# Patient Record
Sex: Female | Born: 2000 | Race: White | Hispanic: No | Marital: Single | Smoking: Never smoker
Health system: Southern US, Community
[De-identification: ages and names within clinical notes are randomized; demographics above are authoritative.]

## PROBLEM LIST (undated history)

## (undated) DIAGNOSIS — Z789 Other specified health status: Secondary | ICD-10-CM

## (undated) HISTORY — PX: NO PAST SURGERIES: SHX2092

---

## 2019-11-13 ENCOUNTER — Other Ambulatory Visit: Payer: Self-pay

## 2019-11-13 ENCOUNTER — Ambulatory Visit (INDEPENDENT_AMBULATORY_CARE_PROVIDER_SITE_OTHER): Payer: Medicaid Other

## 2019-11-13 VITALS — Ht 62.0 in

## 2019-11-13 DIAGNOSIS — Z3201 Encounter for pregnancy test, result positive: Secondary | ICD-10-CM

## 2019-11-13 DIAGNOSIS — Z3400 Encounter for supervision of normal first pregnancy, unspecified trimester: Secondary | ICD-10-CM | POA: Insufficient documentation

## 2019-11-13 DIAGNOSIS — Z3200 Encounter for pregnancy test, result unknown: Secondary | ICD-10-CM

## 2019-11-13 LAB — POCT URINE PREGNANCY: Preg Test, Ur: POSITIVE — AB

## 2019-11-13 MED ORDER — BLOOD PRESSURE KIT DEVI: 1.0000 | 0 refills | Status: DC | PRN

## 2019-11-13 NOTE — Progress Notes (Signed)
Patient was assessed and managed by nursing staff during this encounter. I have reviewed the chart and agree with the documentation and plan. I have also made any necessary editorial changes.  Catalina Antigua, MD 11/13/2019 10:13 AM

## 2019-11-13 NOTE — Progress Notes (Signed)
..    Virtual Visit via Telephone Note  I connected with Laurie [name] on 11/13/19 at  8:20 AM EDT by telephone and verified that I am speaking with the correct person using two identifiers.  Location:[facility]  I discussed the limitations, risks, security and privacy concerns of performing an evaluation and management service by telephone and the availability of in person appointments. I also discussed with the patient that there may be a patient responsible charge related to this service. The patient expressed understanding and agreed to proceed.   History of Present Illness: PRENATAL INTAKE SUMMARY  Laurie [name] presents today New OB Nurse Interview.  OB History    Gravida  1   Para      Term      Preterm      AB      Living        SAB      TAB      Ectopic      Multiple      Live Births             I have reviewed the patient's medical, obstetrical, social, and family histories, medications, and available lab results.  SUBJECTIVE She has no unusual complaints   Observations/Objective: Initial nurse interview for history/labs (New OB)  EDD: 06-09-20 GA: [redacted]w[redacted]d GP: G1P0  GENERAL APPEARANCE: alert, well appearing  Assessment and Plan: Normal pregnancy First pregnancy, Pt denies history of any pre-existing conditions, FOB is involved and living together. [facility], Babyscripts sent to email.  Follow Up Instructions:   I discussed the assessment and treatment plan with the patient. The patient was provided an opportunity to ask questions and all were answered. The patient agreed with the plan and demonstrated an understanding of the instructions.   The patient was advised to call back or seek an in-person evaluation if the symptoms worsen or if the condition fails to improve as anticipated.  I provided 15 minutes of non-face-to-face time during this encounter.   Katrina Stack, RN

## 2019-12-01 ENCOUNTER — Other Ambulatory Visit: Payer: Self-pay

## 2019-12-01 ENCOUNTER — Other Ambulatory Visit (HOSPITAL_COMMUNITY)
Admission: RE | Admit: 2019-12-01 | Discharge: 2019-12-01 | Disposition: A | Discharge status: Home / Self Care | Payer: Medicaid Other | Source: Ambulatory Visit | Attending: Obstetrics and Gynecology | Admitting: Obstetrics and Gynecology

## 2019-12-01 ENCOUNTER — Encounter: Payer: Self-pay | Admitting: Obstetrics and Gynecology

## 2019-12-01 ENCOUNTER — Ambulatory Visit (INDEPENDENT_AMBULATORY_CARE_PROVIDER_SITE_OTHER): Payer: Medicaid Other | Admitting: Obstetrics and Gynecology

## 2019-12-01 ENCOUNTER — Ambulatory Visit (INDEPENDENT_AMBULATORY_CARE_PROVIDER_SITE_OTHER): Payer: Medicaid Other

## 2019-12-01 VITALS — BP 112/77 | HR 93 | Wt 131.0 lb

## 2019-12-01 DIAGNOSIS — Z3401 Encounter for supervision of normal first pregnancy, first trimester: Secondary | ICD-10-CM

## 2019-12-01 DIAGNOSIS — O3680X0 Pregnancy with inconclusive fetal viability, not applicable or unspecified: Secondary | ICD-10-CM

## 2019-12-01 DIAGNOSIS — Z3A01 Less than 8 weeks gestation of pregnancy: Secondary | ICD-10-CM | POA: Diagnosis not present

## 2019-12-01 DIAGNOSIS — Z3400 Encounter for supervision of normal first pregnancy, unspecified trimester: Secondary | ICD-10-CM | POA: Insufficient documentation

## 2019-12-01 NOTE — Patient Instructions (Signed)
First Trimester of Pregnancy  The first trimester of pregnancy is from week 1 until the end of week 13 (months 1 through 3). During this time, your baby will begin to develop inside you. At 6-8 weeks, the eyes and face are formed, and the heartbeat can be seen on ultrasound. At the end of 12 weeks, all the baby's organs are formed. Prenatal care is all the medical care you receive before the birth of your baby. Make sure you get good prenatal care and follow all of your doctor's instructions. Follow these instructions at home: Medicines  Take over-the-counter and prescription medicines only as told by your doctor. Some medicines are safe and some medicines are not safe during pregnancy.  Take a prenatal vitamin that contains at least 600 micrograms (mcg) of folic acid.  If you have trouble pooping (constipation), take medicine that will make your stool soft (stool softener) if your doctor approves. Eating and drinking   Eat regular, healthy meals.  Your doctor will tell you the amount of weight gain that is right for you.  Avoid raw meat and uncooked cheese.  If you feel sick to your stomach (nauseous) or throw up (vomit): ? Eat 4 or 5 small meals a day instead of 3 large meals. ? Try eating a few soda crackers. ? Drink liquids between meals instead of during meals.  To prevent constipation: ? Eat foods that are high in fiber, like fresh fruits and vegetables, whole grains, and beans. ? Drink enough fluids to keep your pee (urine) clear or pale yellow. Activity  Exercise only as told by your doctor. Stop exercising if you have cramps or pain in your lower belly (abdomen) or low back.  Do not exercise if it is too hot, too humid, or if you are in a place of great height (high altitude).  Try to avoid standing for long periods of time. Move your legs often if you must stand in one place for a long time.  Avoid heavy lifting.  Wear low-heeled shoes. Sit and stand up  straight.  You can have sex unless your doctor tells you not to. Relieving pain and discomfort  Wear a good support bra if your breasts are sore.  Take warm water baths (sitz baths) to soothe pain or discomfort caused by hemorrhoids. Use hemorrhoid cream if your doctor says it is okay.  Rest with your legs raised if you have leg cramps or low back pain.  If you have puffy, bulging veins (varicose veins) in your legs: ? Wear support hose or compression stockings as told by your doctor. ? Raise (elevate) your feet for 15 minutes, 3-4 times a day. ? Limit salt in your food. Prenatal care  Schedule your prenatal visits by the twelfth week of pregnancy.  Write down your questions. Take them to your prenatal visits.  Keep all your prenatal visits as told by your doctor. This is important. Safety  Wear your seat belt at all times when driving.  Make a list of emergency phone numbers. The list should include numbers for family, friends, the hospital, and police and fire departments. General instructions  Ask your doctor for a referral to a local prenatal class. Begin classes no later than at the start of month 6 of your pregnancy.  Ask for help if you need counseling or if you need help with nutrition. Your doctor can give you advice or tell you where to go for help.  Do not use hot tubs, steam   rooms, or saunas.  Do not douche or use tampons or scented sanitary pads.  Do not cross your legs for long periods of time.  Avoid all herbs and alcohol. Avoid drugs that are not approved by your doctor.  Do not use any tobacco products, including cigarettes, chewing tobacco, and electronic cigarettes. If you need help quitting, ask your doctor. You may get counseling or other support to help you quit.  Avoid cat litter boxes and soil used by cats. These carry germs that can cause birth defects in the baby and can cause a loss of your baby (miscarriage) or stillbirth.  Visit your dentist.  At home, brush your teeth with a soft toothbrush. Be gentle when you floss. Contact a doctor if:  You are dizzy.  You have mild cramps or pressure in your lower belly.  You have a nagging pain in your belly area.  You continue to feel sick to your stomach, you throw up, or you have watery poop (diarrhea).  You have a bad smelling fluid coming from your vagina.  You have pain when you pee (urinate).  You have increased puffiness (swelling) in your face, hands, legs, or ankles. Get help right away if:  You have a fever.  You are leaking fluid from your vagina.  You have spotting or bleeding from your vagina.  You have very bad belly cramping or pain.  You gain or lose weight rapidly.  You throw up blood. It may look like coffee grounds.  You are around people who have German measles, fifth disease, or chickenpox.  You have a very bad headache.  You have shortness of breath.  You have any kind of trauma, such as from a fall or a car accident. Summary  The first trimester of pregnancy is from week 1 until the end of week 13 (months 1 through 3).  To take care of yourself and your unborn baby, you will need to eat healthy meals, take medicines only if your doctor tells you to do so, and do activities that are safe for you and your baby.  Keep all follow-up visits as told by your doctor. This is important as your doctor will have to ensure that your baby is healthy and growing well. This information is not intended to replace advice given to you by your health care provider. Make sure you discuss any questions you have with your health care provider. Document Revised: 09/18/2018 Document Reviewed: 06/05/2016 Elsevier Patient Education  2020 [employer] Inc[employer]  

## 2019-12-01 NOTE — Progress Notes (Signed)
Pt is here for initial OB visit. Unsure of LMP, possibly 09/03/19. [place] today shows GA [redacted]w[redacted]d giving EDD 07/17/20

## 2019-12-01 NOTE — Progress Notes (Signed)
INITIAL PRENATAL VISIT NOTE  Subjective:  Laurie [name] is a 19 y.o. G1P0 at 1w2dby u/s due to unsure LMP being seen today for her initial prenatal visit. This is a planned pregnancy. She and partner are happy with the pregnancy. She has an uncomplicated medical history. Patient reports no complaints.  Contractions: Not present. Vag. Bleeding: None.   . Denies leaking of fluid.    History reviewed. No pertinent past medical history.  History reviewed. No pertinent surgical history.  OB History  Gravida Para Term Preterm AB Living  1            SAB TAB Ectopic Multiple Live Births               # Outcome Date GA Lbr Len/2nd Weight Sex Delivery Anes PTL Lv  1 Current             Social History   Socioeconomic History   Marital status: Single    Spouse name: Not on file   Number of children: Not on file   Years of education: Not on file   Highest education level: Not on file  Occupational History   Not on file  Tobacco Use   Smoking status: Not on file   Smokeless tobacco: Never Used  Substance and Sexual Activity   Alcohol use: Never   Drug use: Never   Sexual activity: Yes  Other Topics Concern   Not on file  Social History Narrative   Not on file   Social Determinants of Health   Financial Resource Strain:    Difficulty of Paying Living Expenses:   Food Insecurity:    Worried About Running Out of Food in the Last Year:    RArboriculturistin the Last Year:   Transportation Needs:    LFilm/video editor(Medical):    Lack of Transportation (Non-Medical):   Physical Activity:    Days of Exercise per Week:    Minutes of Exercise per Session:   Stress:    Feeling of Stress :   Social Connections:    Frequency of Communication with Friends and Family:    Frequency of Social Gatherings with Friends and Family:    Attends Religious Services:    Active Member of Clubs or Organizations:    Attends CArchivistMeetings:     Marital Status:     History reviewed. No pertinent family history.   Current Outpatient Medications:    Blood Pressure Monitoring (BLOOD PRESSURE KIT) DEVI, 1 kit by Does not apply route as needed., Disp: 1 each, Rfl: 0   Prenatal Vit-Fe Fumarate-FA (MULTIVITAMIN-PRENATAL) 27-0.8 MG TABS tablet, Take 1 tablet by mouth daily at 12 noon., Disp: , Rfl:   No Known Allergies  Review of Systems: Negative except for what is mentioned in HPI.  Objective:   Vitals:   12/01/19 1045  BP: 112/77  Pulse: 93  Weight: 131 lb (59.4 kg)    Fetal Status: Fetal Heart Rate (bpm): 161         Physical Exam: BP 112/77    Pulse 93    Wt 131 lb (59.4 kg)    LMP 09/03/2019    BMI 23.96 kg/m  CONSTITUTIONAL: Well-developed, well-nourished female in no acute distress.  NEUROLOGIC: Alert and oriented to person, place, and time. Normal reflexes, muscle tone coordination. No cranial nerve deficit noted. PSYCHIATRIC: Normal mood and affect. Normal behavior. Normal judgment and thought content. SKIN: Skin is warm  and dry. No rash noted. Not diaphoretic. No erythema. No pallor. HENT:  Normocephalic, atraumatic, External right and left ear normal. Oropharynx is clear and moist EYES: Conjunctivae and EOM are normal.  NECK: Normal range of motion, supple, no masses CARDIOVASCULAR: Normal heart rate noted, regular rhythm RESPIRATORY: Effort and breath sounds normal, no problems with respiration noted BREASTS: symmetric, non-tender, no masses palpable ABDOMEN: Soft, nontender, nondistended, gravid. GU: normal appearing external female genitalia, nulliparous normal appearing cervix, no discharge in vagina, no lesions noted Bimanual: 7 weeks sized uterus, no adnexal tenderness or palpable lesions noted MUSCULOSKELETAL: Normal range of motion. EXT:  No edema and no tenderness. 2+ distal pulses.   Assessment and Plan:  Pregnancy: G1P0 at 47w2dby u/s  1. Supervision of normal first pregnancy, antepartum  -  CBC/D/Plt+RPR+Rh+ABO+Rub Ab... - Culture, OB Urine - Cytology - PAP - Cervicovaginal ancillary only( [facility]) - Genetic Screening  2. Encounter to determine fetal viability of pregnancy, single or unspecified fetus  - [place] Limited; Future   Preterm labor symptoms and general obstetric precautions including but not limited to vaginal bleeding, contractions, leaking of fluid and fetal movement were reviewed in detail with the patient.  Please refer to After Visit Summary for other counseling recommendations.   Return in about 4 weeks (around 12/29/2019) for ROB.  LGriffin Basil6/22/2021 12:58 PM

## 2019-12-02 LAB — CBC/D/PLT+RPR+RH+ABO+RUB AB...
Antibody Screen: NEGATIVE
Basophils Absolute: 0.1 10*3/uL (ref 0.0–0.2)
Basos: 1 %
EOS (ABSOLUTE): 0.2 10*3/uL (ref 0.0–0.4)
Eos: 2 %
HCV Ab: <0.1 s/co ratio (ref 0.0–0.9)
HIV Screen 4th Generation wRfx: NONREACTIVE
Hematocrit: 38.9 % (ref 34.0–46.6)
Hemoglobin: 12.8 g/dL (ref 11.1–15.9)
Hepatitis B Surface Ag: NEGATIVE
Immature Grans (Abs): 0 10*3/uL (ref 0.0–0.1)
Immature Granulocytes: 1 %
Lymphocytes Absolute: 1.4 10*3/uL (ref 0.7–3.1)
Lymphs: 17 %
MCH: 28.9 pg (ref 26.6–33.0)
MCHC: 32.9 g/dL (ref 31.5–35.7)
MCV: 88 fL (ref 79–97)
Monocytes Absolute: 0.5 10*3/uL (ref 0.1–0.9)
Monocytes: 7 %
Neutrophils Absolute: 5.9 10*3/uL (ref 1.4–7.0)
Neutrophils: 72 %
Platelets: 311 10*3/uL (ref 150–450)
RBC: 4.43 x10E6/uL (ref 3.77–5.28)
RDW: 15.3 % (ref 11.7–15.4)
RPR Ser Ql: NONREACTIVE
Rh Factor: POSITIVE
Rubella Antibodies, IGG: 4.46 index (ref >0.99)
WBC: 8 10*3/uL (ref 3.4–10.8)

## 2019-12-02 LAB — HCV INTERPRETATION

## 2019-12-02 LAB — CERVICOVAGINAL ANCILLARY ONLY
Chlamydia: NEGATIVE
Comment: NEGATIVE
Comment: NORMAL
Neisseria Gonorrhea: NEGATIVE

## 2019-12-04 LAB — CYTOLOGY - PAP
Chlamydia: NEGATIVE
Comment: NEGATIVE
Comment: NEGATIVE
Comment: NORMAL
Diagnosis: UNDETERMINED — AB
High risk HPV: POSITIVE — AB
Neisseria Gonorrhea: NEGATIVE

## 2019-12-04 LAB — URINE CULTURE, OB REFLEX

## 2019-12-04 LAB — CULTURE, OB URINE

## 2019-12-08 ENCOUNTER — Other Ambulatory Visit: Payer: Self-pay

## 2019-12-08 ENCOUNTER — Telehealth: Payer: Self-pay

## 2019-12-08 DIAGNOSIS — O2340 Unspecified infection of urinary tract in pregnancy, unspecified trimester: Secondary | ICD-10-CM

## 2019-12-08 MED ORDER — NITROFURANTOIN MONOHYD MACRO 100 MG PO CAPS: 100.0000 mg | ORAL_CAPSULE | Freq: Two times a day (BID) | ORAL | 0 refills | Status: DC

## 2019-12-08 NOTE — Telephone Encounter (Signed)
TC to pt to make aware of pap results ASCUS repeat in 1 yr per Dr.Bass and UTI Rx for Macrobid was sent No answer @ 8:13am and no vm is set up to leave a message.

## 2019-12-29 ENCOUNTER — Ambulatory Visit (INDEPENDENT_AMBULATORY_CARE_PROVIDER_SITE_OTHER): Payer: Medicaid Other | Admitting: Obstetrics and Gynecology

## 2019-12-29 ENCOUNTER — Encounter: Payer: Self-pay | Admitting: Obstetrics and Gynecology

## 2019-12-29 ENCOUNTER — Other Ambulatory Visit: Payer: Self-pay

## 2019-12-29 VITALS — BP 107/70 | HR 93 | Wt 135.2 lb

## 2019-12-29 DIAGNOSIS — Z3400 Encounter for supervision of normal first pregnancy, unspecified trimester: Secondary | ICD-10-CM

## 2019-12-29 DIAGNOSIS — Z3401 Encounter for supervision of normal first pregnancy, first trimester: Secondary | ICD-10-CM

## 2019-12-29 DIAGNOSIS — Z3A11 11 weeks gestation of pregnancy: Secondary | ICD-10-CM

## 2019-12-29 NOTE — Progress Notes (Signed)
   PRENATAL VISIT NOTE  Subjective:  Laurie [name] is a 19 y.o. G1P0 at [redacted]w[redacted]d being seen today for ongoing prenatal care.  She is currently monitored for the following issues for this low-risk pregnancy and has Supervision of normal first pregnancy, antepartum on their problem list.  Patient reports no complaints.  Contractions: Not present. Vag. Bleeding: None.   . Denies leaking of fluid.   The following portions of the patient's history were reviewed and updated as appropriate: allergies, current medications, past family history, past medical history, past social history, past surgical history and problem list.   Objective:   Vitals:   12/29/19 0925  BP: 107/70  Pulse: 93  Weight: 61.3 kg    Fetal Status: Fetal Heart Rate (bpm): 156         General:  Alert, oriented and cooperative. Patient is in no acute distress.  Skin: Skin is warm and dry. No rash noted.   Cardiovascular: Normal heart rate noted  Respiratory: Normal respiratory effort, no problems with respiration noted  Abdomen: Soft, gravid, appropriate for gestational age.  Pain/Pressure: Absent     Pelvic: Cervical exam deferred        Extremities: Normal range of motion.  Edema: None  Mental Status: Normal mood and affect. Normal behavior. Normal judgment and thought content.   Assessment and Plan:  Pregnancy: G1P0 at [redacted]w[redacted]d 1. Supervision of normal first pregnancy, antepartum - Will preform genetic testing today.  Preterm labor symptoms and general obstetric precautions including but not limited to vaginal bleeding, contractions, leaking of fluid and fetal movement were reviewed in detail with the patient. Please refer to After Visit Summary for other counseling recommendations.   Return in about 4 weeks (around 01/26/2020) for ROB.  No future appointments.  Johnny Bridge, MD

## 2020-01-04 ENCOUNTER — Encounter: Payer: Self-pay | Admitting: Obstetrics and Gynecology

## 2020-01-12 ENCOUNTER — Encounter: Payer: Self-pay | Admitting: Obstetrics and Gynecology

## 2020-01-26 ENCOUNTER — Encounter: Payer: Self-pay | Admitting: Obstetrics and Gynecology

## 2020-01-26 ENCOUNTER — Ambulatory Visit (INDEPENDENT_AMBULATORY_CARE_PROVIDER_SITE_OTHER): Payer: Medicaid Other | Admitting: Obstetrics and Gynecology

## 2020-01-26 ENCOUNTER — Other Ambulatory Visit: Payer: Self-pay

## 2020-01-26 ENCOUNTER — Other Ambulatory Visit (HOSPITAL_COMMUNITY)
Admission: RE | Admit: 2020-01-26 | Discharge: 2020-01-26 | Disposition: A | Discharge status: Home / Self Care | Payer: Medicaid Other | Source: Ambulatory Visit | Attending: Obstetrics and Gynecology | Admitting: Obstetrics and Gynecology

## 2020-01-26 DIAGNOSIS — N898 Other specified noninflammatory disorders of vagina: Secondary | ICD-10-CM | POA: Diagnosis present

## 2020-01-26 DIAGNOSIS — O26892 Other specified pregnancy related conditions, second trimester: Secondary | ICD-10-CM | POA: Diagnosis not present

## 2020-01-26 DIAGNOSIS — Z3A15 15 weeks gestation of pregnancy: Secondary | ICD-10-CM | POA: Insufficient documentation

## 2020-01-26 DIAGNOSIS — Z3400 Encounter for supervision of normal first pregnancy, unspecified trimester: Secondary | ICD-10-CM

## 2020-01-26 NOTE — Progress Notes (Signed)
   PRENATAL VISIT NOTE  Subjective:  Laurie [name] is a 19 y.o. G1P0 at [redacted]w[redacted]d being seen today for ongoing prenatal care.  She is currently monitored for the following issues for this low-risk pregnancy and has Supervision of normal first pregnancy, antepartum; Vaginal discharge during pregnancy in second trimester; and [redacted] weeks gestation of pregnancy on their problem list.  Patient doing well with no acute concerns today. She reports vaginal irritation.  Contractions: Not present. Vag. Bleeding: None.  Movement: Present. Denies leaking of fluid.   Pt notes she does not eat any vegetables and is concerned about her nutrition now that she is pregnant.  She has never really eaten vegetables.  Pt will receive dietary referral.  The following portions of the patient's history were reviewed and updated as appropriate: allergies, current medications, past family history, past medical history, past social history, past surgical history and problem list. Problem list updated.  Objective:   Vitals:   01/26/20 0949  BP: 110/74  Pulse: 92  Weight: 137 lb (62.1 kg)    Fetal Status: Fetal Heart Rate (bpm): 146 Fundal Height: 15 cm Movement: Present     General:  Alert, oriented and cooperative. Patient is in no acute distress.  Skin: Skin is warm and dry. No rash noted.   Cardiovascular: Normal heart rate noted  Respiratory: Normal respiratory effort, no problems with respiration noted  Abdomen: Soft, gravid, appropriate for gestational age.  Pain/Pressure: Absent     Pelvic: Cervical exam deferred        Extremities: Normal range of motion.  Edema: None  Mental Status:  Normal mood and affect. Normal behavior. Normal judgment and thought content.   Assessment and Plan:  Pregnancy: G1P0 at [redacted]w[redacted]d  1. Supervision of normal first pregnancy, antepartum Routine PNC, anatomy scan ordered - AFP, Serum, Open Spina Bifida - [place] MFM OB COMP + 14 WK; Future - Referral to Nutrition and Diabetes  Services  2. Vaginal discharge during pregnancy in second trimester  - Cervicovaginal ancillary only( [facility])  Preterm labor symptoms and general obstetric precautions including but not limited to vaginal bleeding, contractions, leaking of fluid and fetal movement were reviewed in detail with the patient.  Please refer to After Visit Summary for other counseling recommendations.   Return in about 4 weeks (around 02/23/2020) for ROB, in person.   Mariel Aloe, MD

## 2020-01-26 NOTE — Patient Instructions (Signed)

## 2020-01-26 NOTE — Progress Notes (Signed)
ROB    CC: Frequent Vaginal Yeast Infections.   Pt Declines vaginal swab today would like prescription for yeast.

## 2020-01-27 ENCOUNTER — Telehealth: Payer: Self-pay

## 2020-01-27 LAB — CERVICOVAGINAL ANCILLARY ONLY
Bacterial Vaginitis (gardnerella): POSITIVE — AB
Candida Glabrata: NEGATIVE
Candida Vaginitis: POSITIVE — AB
Chlamydia: NEGATIVE
Comment: NEGATIVE
Comment: NEGATIVE
Comment: NEGATIVE
Comment: NEGATIVE
Comment: NEGATIVE
Comment: NORMAL
Neisseria Gonorrhea: NEGATIVE
Trichomonas: NEGATIVE

## 2020-01-27 MED ORDER — METRONIDAZOLE 500 MG PO TABS
500.0000 mg | ORAL_TABLET | Freq: Two times a day (BID) | ORAL | 0 refills | Status: DC
Start: 2020-01-27 — End: 2020-02-23

## 2020-01-27 MED ORDER — TERCONAZOLE 0.4 % VA CREA: 1.0000 | TOPICAL_CREAM | Freq: Every day | VAGINAL | 0 refills | Status: DC

## 2020-01-27 NOTE — Telephone Encounter (Signed)
S/w pt and advised of results and rx 

## 2020-01-28 LAB — AFP, SERUM, OPEN SPINA BIFIDA
AFP MoM: 2.06
AFP Value: 68.7 ng/mL
Gest. Age on Collection Date: 15.3 weeks
Maternal Age At EDD: 21.8 yr
OSBR Risk 1 IN: 706
Test Results:: NEGATIVE
Weight: 137 [lb_av]

## 2020-01-29 ENCOUNTER — Encounter: Payer: Medicaid Other | Attending: Obstetrics and Gynecology | Admitting: Dietician

## 2020-02-02 ENCOUNTER — Telehealth: Payer: Self-pay

## 2020-02-02 NOTE — Telephone Encounter (Signed)
Unable to reach pt to r/s N&D appt; vm not set up

## 2020-02-02 NOTE — Telephone Encounter (Signed)
-----   Message from Warden Fillers, MD sent at 02/01/2020  8:41 AM EDT ----- Regarding: nutrition Pt missed her nutrition appointment.  Please contact pt and advise her to reschedule.

## 2020-02-03 NOTE — Telephone Encounter (Signed)
2nd attempt - unable to reach pt regarding missed N&D appt Letter mailed

## 2020-02-23 ENCOUNTER — Ambulatory Visit: Payer: Medicaid Other | Attending: Obstetrics and Gynecology

## 2020-02-23 ENCOUNTER — Encounter: Payer: Self-pay | Admitting: Obstetrics and Gynecology

## 2020-02-23 ENCOUNTER — Ambulatory Visit (INDEPENDENT_AMBULATORY_CARE_PROVIDER_SITE_OTHER): Payer: Medicaid Other | Admitting: Obstetrics and Gynecology

## 2020-02-23 ENCOUNTER — Other Ambulatory Visit: Payer: Self-pay

## 2020-02-23 ENCOUNTER — Other Ambulatory Visit: Payer: Self-pay | Admitting: *Deleted

## 2020-02-23 VITALS — BP 137/71 | HR 80 | Wt 138.0 lb

## 2020-02-23 DIAGNOSIS — Z3A19 19 weeks gestation of pregnancy: Secondary | ICD-10-CM

## 2020-02-23 DIAGNOSIS — Z362 Encounter for other antenatal screening follow-up: Secondary | ICD-10-CM

## 2020-02-23 DIAGNOSIS — Z3400 Encounter for supervision of normal first pregnancy, unspecified trimester: Secondary | ICD-10-CM | POA: Diagnosis not present

## 2020-02-23 DIAGNOSIS — Z363 Encounter for antenatal screening for malformations: Secondary | ICD-10-CM

## 2020-02-23 NOTE — Progress Notes (Signed)
Pt was seen for u/s this morning. Was scheduled for repeat due to fetal position.

## 2020-02-23 NOTE — Progress Notes (Signed)
   PRENATAL VISIT NOTE  Subjective:  Laurie [name] is a 19 y.o. G1P0 at [redacted]w[redacted]d being seen today for ongoing prenatal care.  She is currently monitored for the following issues for this low-risk pregnancy and has Supervision of normal first pregnancy, antepartum; Vaginal discharge during pregnancy in second trimester; and [redacted] weeks gestation of pregnancy on their problem list.  Patient reports no complaints.  Contractions: Not present. Vag. Bleeding: None.  Movement: Present. Denies leaking of fluid.   The following portions of the patient's history were reviewed and updated as appropriate: allergies, current medications, past family history, past medical history, past social history, past surgical history and problem list.   Objective:   Vitals:   02/23/20 1113  BP: 137/71  Pulse: 80  Weight: 138 lb (62.6 kg)    Fetal Status: Fetal Heart Rate (bpm): 145   Movement: Present     General:  Alert, oriented and cooperative. Patient is in no acute distress.  Skin: Skin is warm and dry. No rash noted.   Cardiovascular: Normal heart rate noted  Respiratory: Normal respiratory effort, no problems with respiration noted  Abdomen: Soft, gravid, appropriate for gestational age.  Pain/Pressure: Absent     Pelvic: Cervical exam deferred        Extremities: Normal range of motion.     Mental Status: Normal mood and affect. Normal behavior. Normal judgment and thought content.   Assessment and Plan:  Pregnancy: G1P0 at [redacted]w[redacted]d 1. Supervision of normal first pregnancy, antepartum - Ultrasound performed today within normal limits.  Incomplete anatomy seen.  Patient is scheduled for fu ultrasound in 4 weeks. - Benefits of the flu vaccination discussed.  Patient declines vaccination.    Preterm labor symptoms and general obstetric precautions including but not limited to vaginal bleeding, contractions, leaking of fluid and fetal movement were reviewed in detail with the patient. Please refer to After  Visit Summary for other counseling recommendations.   Return in about 4 weeks (around 03/22/2020) for LROB - virtual.  Future Appointments  Date Time Provider Department Center  03/22/2020 12:45 PM [facility]    Johnny Bridge, MD

## 2020-02-23 NOTE — Patient Instructions (Signed)
Preventing Influenza, Adult Influenza, more commonly known as "the flu," is a viral infection that mainly affects the respiratory tract. The respiratory tract includes structures that help you breathe, such as the lungs, nose, and throat. The flu causes many common cold symptoms, as well as a high fever and body aches. The flu spreads easily from person to person (is contagious). The flu is most common from December through March. This is called flu season.You can catch the flu virus by:  Breathing in droplets from an infected person's cough or sneeze.  Touching something that was recently contaminated with the virus and then touching your mouth, nose, or eyes. What can I do to lower my risk?        You can decrease your risk of getting the flu by:  Getting a flu shot (influenza vaccination) every year. This is the best way to prevent the flu. A flu shot is recommended for everyone age 6 months and older. ? It is best to get a flu shot in the fall, as soon as it is available. Getting a flu shot during winter or spring instead is still a good idea. Flu season can last into early spring. ? Preventing the flu through vaccination requires getting a new flu shot every year. This is because the flu virus changes slightly (mutates) from one year to the next. Even if a flu shot does not completely protect you from all flu virus mutations, it can reduce the severity of your illness and prevent dangerous complications of the flu. ? If you are pregnant, you can and should get a flu shot. ? If you have had a reaction to the shot in the past or if you are allergic to eggs, check with your health care provider before getting a flu shot. ? Sometimes the vaccine is available as a nasal spray. In some years, the nasal spray has not been as effective against the flu virus. Check with your health care provider if you have questions about this.  Practicing good health habits. This is especially important during  flu season. ? Avoid contact with people who are sick with flu or cold symptoms. ? Wash your hands with soap and water often. If soap and water are not available, use alcohol-based hand sanitizer. ? Avoid touching your hands to your face, especially when you have not washed your hands recently. ? Use a disinfectant to clean surfaces at home and at work that may be contaminated with the flu virus. ? Keep your body's disease-fighting system (immune system) in good shape by eating a healthy diet, drinking plenty of fluids, getting enough sleep, and exercising regularly. If you do get the flu, avoid spreading it to others by:  Staying home until your symptoms have been gone for at least one day.  Covering your mouth and nose when you cough or sneeze.  Avoiding close contact with others, especially babies and elderly people. Why are these changes important? Getting a flu shot and practicing good health habits protects you as well as other people. If you get the flu, your friends, family, and co-workers are also at risk of getting it, because it spreads so easily to others. Each year, about 2 out of every 10 people get the flu. Having the flu can lead to complications, such as pneumonia, ear infection, and sinus infection. The flu also can be deadly, especially for babies, people older than age 65, and people who have serious long-term diseases. How is this treated? Most   people recover from the flu by resting at home and drinking plenty of fluids. However, a prescription antiviral medicine may reduce your flu symptoms and may make your flu go away sooner. This medicine must be started within a few days of getting flu symptoms. You can talk with your health care provider about whether you need an antiviral medicine. Antiviral medicine may be prescribed for people who are at risk for more serious flu symptoms. This includes people who:  Are older than age 65.  Are pregnant.  Have a condition that  makes the flu worse or more dangerous. Where to find more information  Centers for Disease Control and Prevention: www.cdc.gov/flu/index.htm  Flu.gov: www.flu.gov/prevention-vaccination  American Academy of [employer]icians: familydoctor.org/familydoctor/en/kids/vaccines/preventing-the-flu.html Contact a health care provider if:  You have influenza and you develop new symptoms.  You have: ? Chest pain. ? Diarrhea. ? A fever.  Your cough gets worse, or you produce more mucus. Summary  The best way to prevent the flu is to get a flu shot every year in the fall.  Even if you get the flu after you have received the yearly vaccine, your flu may be milder and go away sooner because of your flu shot.  If you get the flu, antiviral medicines that are started with a few days of symptoms may reduce your flu symptoms and may make your flu go away sooner.  You can also help prevent the flu by practicing good health habits. This information is not intended to replace advice given to you by your health care provider. Make sure you discuss any questions you have with your health care provider. Document Revised: 05/10/2017 Document Reviewed: 02/04/2016 Elsevier Patient Education  2020 [employer]  

## 2020-03-22 ENCOUNTER — Ambulatory Visit: Payer: Medicaid Other | Attending: Obstetrics

## 2020-03-22 ENCOUNTER — Other Ambulatory Visit: Payer: Self-pay

## 2020-03-22 ENCOUNTER — Telehealth (INDEPENDENT_AMBULATORY_CARE_PROVIDER_SITE_OTHER): Payer: Medicaid Other | Admitting: Obstetrics and Gynecology

## 2020-03-22 ENCOUNTER — Encounter: Payer: Self-pay | Admitting: Obstetrics and Gynecology

## 2020-03-22 DIAGNOSIS — Z3400 Encounter for supervision of normal first pregnancy, unspecified trimester: Secondary | ICD-10-CM

## 2020-03-22 DIAGNOSIS — Z3A23 23 weeks gestation of pregnancy: Secondary | ICD-10-CM

## 2020-03-22 DIAGNOSIS — Z3402 Encounter for supervision of normal first pregnancy, second trimester: Secondary | ICD-10-CM

## 2020-03-22 DIAGNOSIS — Z362 Encounter for other antenatal screening follow-up: Secondary | ICD-10-CM | POA: Insufficient documentation

## 2020-03-22 NOTE — Progress Notes (Signed)
Virtual ROB  CC: swelling while standing at work.  Pt not able to check B/P while on phone today. Denies any HA's and no dizziness.

## 2020-03-22 NOTE — Progress Notes (Signed)
   PRENATAL VISIT NOTE  Subjective:  Laurie [name] is a 19 y.o. G1P0 at [redacted]w[redacted]d being seen today for ongoing prenatal care.  She is currently monitored for the following issues for this low-risk pregnancy and has Supervision of normal first pregnancy, antepartum; Vaginal discharge during pregnancy in second trimester; and [redacted] weeks gestation of pregnancy on their problem list.  Patient reports no complaints.  Contractions: Not present. Vag. Bleeding: None.  Movement: Present. Denies leaking of fluid.   The patient was unable to check her blood pressure today.    The following portions of the patient's history were reviewed and updated as appropriate: allergies, current medications, past family history, past medical history, past social history, past surgical history and problem list.   Objective:  There were no vitals filed for this visit.  Fetal Status:     Movement: Present     General:  Alert, oriented and cooperative. Patient is in no acute distress.  Skin: Skin is warm and dry. No rash noted.   Cardiovascular: Normal heart rate noted  Respiratory: Normal respiratory effort, no problems with respiration noted  Abdomen: Soft, gravid, appropriate for gestational age.  Pain/Pressure: Absent     Pelvic: Cervical exam deferred        Extremities: Normal range of motion.  Edema: Trace  Mental Status: Normal mood and affect. Normal behavior. Normal judgment and thought content.   Assessment and Plan:  Pregnancy: G1P0 at [redacted]w[redacted]d 1. Supervision of normal first pregnancy, antepartum - Patient for follow up anatomy screen today.    Preterm labor symptoms and general obstetric precautions including but not limited to vaginal bleeding, contractions, leaking of fluid and fetal movement were reviewed in detail with the patient. Please refer to After Visit Summary for other counseling recommendations.   No follow-ups on file.  Future Appointments  Date Time Provider Department Center  03/22/2020  12:45 PM [facility] US[facility]    Johnny Bridge, MD

## 2020-04-19 ENCOUNTER — Ambulatory Visit (INDEPENDENT_AMBULATORY_CARE_PROVIDER_SITE_OTHER): Payer: Medicaid Other | Admitting: Nurse Practitioner

## 2020-04-19 ENCOUNTER — Other Ambulatory Visit: Payer: Self-pay

## 2020-04-19 ENCOUNTER — Encounter: Payer: Self-pay | Admitting: Nurse Practitioner

## 2020-04-19 VITALS — BP 126/75 | HR 93 | Wt 153.8 lb

## 2020-04-19 DIAGNOSIS — Z719 Counseling, unspecified: Secondary | ICD-10-CM

## 2020-04-19 DIAGNOSIS — Z3400 Encounter for supervision of normal first pregnancy, unspecified trimester: Secondary | ICD-10-CM

## 2020-04-19 DIAGNOSIS — Z3A27 27 weeks gestation of pregnancy: Secondary | ICD-10-CM | POA: Diagnosis not present

## 2020-04-19 NOTE — Progress Notes (Signed)
ROB 27w  CC: swelling in feet after working.   Pt brought cuff to office today states lasat virtual she did not know how to use cuff  *Pt was shown correct way to use B/P cuff.

## 2020-04-19 NOTE — Progress Notes (Signed)
    Subjective:  Laurie [name] is a 19 y.o. G1P0 at [redacted]w[redacted]d being seen today for ongoing prenatal care.  She is currently monitored for the following issues for this low-risk pregnancy and has Supervision of normal first pregnancy, antepartum and Vaginal discharge during pregnancy in second trimester on their problem list.  Patient reports swelling of ankles when she woris.  Contractions: Not present. Vag. Bleeding: None.  Movement: Present. Denies leaking of fluid.   The following portions of the patient's history were reviewed and updated as appropriate: allergies, current medications, past family history, past medical history, past social history, past surgical history and problem list. Problem list updated.  Objective:   Vitals:   04/19/20 1100  BP: 126/75  Pulse: 93  Weight: 153 lb 12.8 oz (69.8 kg)    Fetal Status: Fetal Heart Rate (bpm): 150 Fundal Height: 28 cm Movement: Present     General:  Alert, oriented and cooperative. Patient is in no acute distress.  Skin: Skin is warm and dry. No rash noted.   Cardiovascular: Normal heart rate noted  Respiratory: Normal respiratory effort, no problems with respiration noted  Abdomen: Soft, gravid, appropriate for gestational age. Pain/Pressure: Absent     Pelvic:  Cervical exam deferred        Extremities: Normal range of motion.  Edema: Trace  Mental Status: Normal mood and affect. Normal behavior. Normal judgment and thought content.   Urinalysis:      Assessment and Plan:  Pregnancy: G1P0 at [redacted]w[redacted]d  1. Supervision of normal first pregnancy, antepartum Advised compression knee highs to wear at work Advised to select pediatrician - list given Reviewed guidance for fasting appointment at next visit for glucola Advised signing up for breastfeeding and childbirth classes Staff taught client how to use BP cuff today - does not have babyscripts app - reviewed BP reading that would be too high in pregnancy 140/90 - either  value  COVID-19 Vaccine Counseling: The patient was counseled on the potential benefits and lack of known risks of COVID vaccination, during pregnancy and breastfeeding, during today's visit. The patient's questions and concerns were addressed today, including safety of the vaccination and potential side effects as they have been published by ACOG and [facility]. The patient has been informed that there have not been any documented vaccine related injuries, deaths or birth defects to infant or mom after receiving the COVID-19 vaccine to date. The patient has been made aware that although she is not at increased risk of contracting COVID-19 during pregnancy, she is at increased risk of developing severe disease and complications if she contracts COVID-19 while pregnant. All patient questions were addressed during our visit today. The patient is still unsure of her decision for vaccination.    Preterm labor symptoms and general obstetric precautions including but not limited to vaginal bleeding, contractions, leaking of fluid and fetal movement were reviewed in detail with the patient. Please refer to After Visit Summary for other counseling recommendations.  Return in about 2 weeks (around 05/03/2020) for ROB and early AM appointment for 2 hr glucola.  Nolene Bernheim, RN, MSN, NP-BC Nurse Practitioner, [facility] for [employer], [facility] 04/19/2020 1:25 PM

## 2020-04-19 NOTE — Patient Instructions (Signed)
Sign up for classes at [email or web address]   Check out Covid vaccine info here:   [email or web address]  7 Things You Should Know About the COVID-19 Vaccines  Here are seven facts you should know before taking your shot:  1.  No serious side effects were reported in clinical trials. Temporary reactions after receiving the vaccine may include a sore arm, headache, feeling tired and achy for a day or two or, in some cases, fever. In most cases, these reactions are good signs that your body is building protection.   2.  Scientists had a head start. They are built on decades of research on vaccines for similar viruses. A big investment of resources and focus made sure they were created without skipping any steps in development, testing, or clinical trials.  3. You cannot get COVID-19 from the vaccine. The vaccine gives your body instructions to make a protein that safely teaches you to make germ-fighting antibodies to fight the real COVID-19.  4.The vaccine protects against the Delta variant. The Delta variant, which is now predominant in [place], is much more contagious than the original virus. Vaccines continue to be remarkably effective in reducing risk of severe disease, hospitalization, and death, even against the Delta variant.  5. A hundred million people in the [place] have already received their COVID-19 vaccine.  6. It works. And once you're fully vaccinated you're protected. The vaccines are proven to help prevent COVID-19 and are effective in preventing hospitalization and death.   7. The vaccine does not affect fertility. Vaccination for those who are pregnant or wanting to become pregnant is recommended by the [employer] of Obstetricians and Gynecologists (ACOG), the Society for Maternal-Fetal Medicine (SMFM), the American Society for Reproductive Medicine (ASRM), and the Society for Female Reproduction and Urology.   AREA PEDIATRIC/FAMILY PRACTICE  PHYSICIANS  [place]outheast [place] ([address]) . [facility] Melodie Bouillon, MD; Lum Babe, MD; Sheffield Slider, MD; Leveda Anna, MD; McDiarmid, MD; Jerene Bears, MD; Jennette Kettle, MD; Gwendolyn Grant, MD o [address] [place], [place] [address] o [phone] o Mon-Fri 8:30-12:30, 1:30-5:00 o Providers come to see babies at [facility] o Accepting Medicaid . [facility] [place] o Limited providers who accept newborns: Docia Chuck, MD; Kateri Plummer, MD; Paulino Rily, MD o [address], [place], [place] [address] o [phone] o Mon-Fri 8:00-5:30 o Babies seen by providers at [facility] o Does NOT accept Medicaid o Please call early in hospitalization for appointment (limited availability)  . [facility] Fatima Sanger, MD o [address], [place], [place] [address] o [phone] o Mon, Tue, Thur, Fri 8:30-5:00, Wed 10:00-7:00 (closed 1-2pm) o Babies seen by [facility] providers o Accepting Medicaid . Donnie Coffin - Pediatrician Fae Pippin, MD o [address], [place], [place] [address] o [phone] o Mon-Fri 8:30-5:00, Sat 8:30-12:00 o Provider comes to see babies at [facility] o Accepting Medicaid o Must have been referred from current patients or contacted office prior to delivery . [facility]) Leotis Pain, MD; Ave Filter, MD; Luna Fuse, MD; Kennedy Bucker, MD; Konrad Dolores, MD; Kathlene November, MD; Jenne Campus, MD; Lubertha South, MD; Wynetta Emery, MD; Duffy Rhody, MD; Gerre Couch, NP; Shirl Harris, NP o [address], [place], [place] [address] o [phone] o Mon, Tue, Thur, Fri 8:30-5:30, Wed 9:30-5:30, Sat 8:30-12:30 o Babies seen by [facility] providers o Accepting Medicaid o Only accepting infants of first-time parents or siblings of current patients [facility] discharge coordinator will  make follow-up appointment . Cyril Mourning o [address], [place], [place]   [address] o [phone]   Fax - [phone] . [facility] o [address], [place], [place]  [address] o Phone - [phone]   Fax - [phone] . Lucio Edward o [address], [place], [place]  [address] o [phone]  East/[place]ortheast [place] [phone]) . [facility] Jorge Mandril, MD; Alita Chyle, MD; Princella Ion, MD; MD; Earlene Plater, MD; Jamesetta Orleans, MD; Alvera Novel, MD; Clarene Duke, MD; Rana Snare, MD; Carmon Ginsberg, MD; Alinda Money, MD; Hosie Poisson, MD; Mayford Knife, MD o [address], [place], [place] [address] o [phone] o Mon-Fri 8:30-5:00 (extended evenings Mon-Thur as needed), Sat-Sun 10:00-1:00 o Providers come to see babies at [facility] o Accepting Medicaid for families of first-time babies and families with all children in the household age 67 and under. Must register with office prior to making appointment (M-F only). [facility] Odella Aquas, NP; Lynelle Doctor, MD; Susann Givens, MD; [place], [place] o [address] [place], [place] [address] o [phone] o Mon-Fri 8:00-5:00 o Babies seen by providers at [facility] o Does NOT accept Medicaid/Commercial Insurance Only . [facility] [place] ([facility])  Suzette Battiest, MD; Zachery Dauer, MD; Stefan Church, MD; Sabino Dick, MD; Quitman Livings, MD; Farris Has, MD; Gaynell Face, MD; Betha Loa, MD; Colon Flattery, MD; Clifton James, MD o [address] [place] [place], [place] [address] o [phone] o Mon-Fri 8:30-5:30, Sat (Oct.-Mar.) 9:00-1:00 o Babies seen by providers at [facility] o Accepting [facility] [phone]) . [facility] Dimitri, MD; Sheliah Hatch, MD o [address], [place], [place] [address] o [phone] o Mon-Fri 8:30-5:00, Sat 8:30-12:00 o Providers come to see babies at [facility] o Does NOT accept Medicaid . [facility] Cindy Hazy, [place]; Story, MD; [place], [place]; Wynelle Link, MD; Azucena Cecil, MD o [address] [place], [place]  [address] o [phone] o Mon-Fri 8:00-5:00 o Babies seen by providers at [facility] o Does NOT accept Medicaid o Only accepting babies of parents who are patients o Please call early in hospitalization for appointment (limited availability) . [facility] Pediatricians Lamar Benes, MD; Abran Cantor, MD; Early Osmond, MD; Cherre Huger, NP; Hyacinth Meeker, MD; Dwan Bolt, MD; Jarold Motto, NP; Dario Guardian, MD; Talmage Nap, MD; Maisie Fus, MD; Pricilla Holm, MD; Tama High, MD o [address] [place], [place], [place] [address] o [phone] o Mon-Fri 8:00-5:00, Sat 9:00-12:00 o Providers come to see babies at [facility] o Does NOT accept [facility] [phone]) . [facility] at [facility] o Limited providers accepting new patients: Drema Pry, NP; [place]on, PA o [address], [place], [place] [address] o [phone] o Mon-Fri 8:00-5:00 o Babies seen by providers at [facility] o Does NOT accept Medicaid o Only accepting babies of parents who are patients o Please call early in hospitalization for appointment (limited availability) . [facility] Luan Pulling, MD; Nash Dimmer, MD o [address] [place], [place] [address] o [phone] (press 1 to schedule appointment) o Mon-Fri 8:00-5:00 o Providers come to see babies at [facility] o Does NOT accept Medicaid . [facility] Cristino Martes, MD o [address], [place], [place] [address] o [phone] o Mon-Fri 8:30-5:00 (lunch 12:30-1:00), extended hours by appointment only Wed 5:00-6:30 o Babies seen by [facility] providers o Accepting Medicaid . [facility] Abbot, MD; [place], MD; Hassan Rowan, MD o [address] [place], [place], [place] [address] o [phone] o Mon-Fri 8:00-5:00 o Babies seen by [facility]  providers o Does NOT accept Medicaid . Nature conservation officer at [place] [address] Elsworth Soho, MD; Durene Cal, MD; [place], DO o [address] [place], [place] [address] o [phone] o Mon-Fri  8:00-5:00 o Babies seen by [facility] providers o Does NOT accept Medicaid . [facility] o [place], [place]; [place], [place]; Luquillo, NP; Avis Epley, MD; Vonna Kotyk, MD; Clance Boll, MD; Stevphen Rochester, NP; Arvilla Market, NP; Ann Maki, NP; Otis Dials, NP; Vaughan Basta, MD; Alianza, MD o [address] [place], [place] [address] o [phone] o Mon-Fri 8:30-5:00, Sat 10:00-1:00 o Providers come to see babies at [facility] o Does NOT accept Medicaid o Free prenatal information session Tuesdays at 4:45pm . [facility] Luna Kitchens, MD; [place], [place]; [place], [place]; [place], [place] o [address] [place] [address] o [phone] o Mon-Fri 7:30-5:30 o Babies seen by [facility] providers . [facility] Doctor o [address], [place], [place]  [address] o [phone]   Fax - [phone]  [place] [phone] & [phone]) . [facility] Alphonsa Overall, MD o [address] [place], [place] [address] o [phone] o Mon-Thur 8:00-6:00 o Providers come to see babies at [facility] o Accepting Medicaid . [facility] Zenon Mayo, NP; Cyndia Bent, MD; [place], [place]; [place], [place] o [address] [place], [place] [address] o [phone] o Mon-Thur 7:30-7:30, Fri 7:30-4:30 o Babies seen by [facility] providers o Accepting Medicaid . [facility] Cheryle Horsfall, MD; Janene Harvey, NP; Vonita Moss, MD o [address], [place], [place] [address] o [phone] o Mon-Fri 8:30-5:00, Sat 8:30-12:00 o Providers come to see babies at [facility] o Accepting Medicaid o Must have "Meet & Greet" appointment at office prior to delivery . [facility] [place] [facility] ([facility]) Llana Aliment, MD; Earlene Plater, MD; Lucretia Roers, MD o [address], [place], [place] [address] o [phone] o Mon-Wed 8:00-6:00, Thur-Fri 8:00-5:00, Sat 9:00-12:00 o Providers come to see babies at [facility] o Does NOT accept Medicaid o Only accepting siblings of current patients . [facility]  o [address], [place], [place]  [address] o [phone]   Fax - [phone] . [facility] at [facility] o 717 091 [address], [place], [place]  [address] o [phone]   Fax - [phone]  [place] [phone] & [phone]) . Nature conservation officer at [facility] o [place], DO; Townsend, DO o [address] [place], [place] [address] o [phone] o Mon-Fri 7:00-5:00 o Babies seen by [facility] providers o Does NOT accept Medicaid . [facility] Ellis Savage, MD; [place], [place]; [place], [place] o [address], [place], [place] [address] o [phone] o Mon-Fri 8:00-5:00 o Babies seen by [facility] providers o Accepting Medicaid . [facility] - [address] Franne Forts, MD; [place], [place]; [place], NP; [place], [place] o [address] [place], [place], [place] [address] o [phone] o Mon-Fri 8:00-5:00 o Babies seen by providers at [facility] o Accepting [facility] [phone]) . [facility], [place] o [address] [place], [place] [address] o [phone] o Mon-Fri 8:00-5:00 o Babies seen by [facility] providers o Does NOT accept Medicaid o Limited availability, please call early in hospitalization to schedule follow-up . [facility] Jolee Ewing, PA; Eddie Candle, MD; [place], MD; [place], [place]; Constance Goltz, MD; [place], [place] [address] [phone] [address], [place], [place] [address] o [phone] o Mon-Fri 8:30-5:00, Sat 9:00-12:00 o Babies seen by providers at [facility] o Accepting Medicaid o Please register online then schedule online or call office o [email or web address] . [facility]) Samuella Bruino Hunter, NP;  Lucianne MussKumar, MD; Lanier ClamMartin Rogers, PA o [address], [place], [place] [address] o [phone] o Mon-Fri 8:00-5:00 o Babies seen by providers at [facility] o Accepting Medicaid . [facility] ([facility]) Sharin Monso [place], MD; Reed BreechKristi Fleenor, NP; Shelva MajesticWest, MD o [address], [place], [place] [address] o [phone] o Mon-Fri 8:00-5:30, Sat&Sun by appointment (phones open at 8:30) o Babies seen by [facility] providers o Accepting Medicaid o Must be a first-time baby or sibling of current patient . [facility]  o [address], [address], [place], [place]  [address] o [phone]   Fax - [phone]  [place] [phone] & [phone]) . [facility] o [place], [place]; [place], [place]; Dimple Caseyice, MD; [place], [place]; Carolyne FiscalSpry, MD o [address] [place], [place] [address] o [phone] o Mon-Thur 8:00-7:00, Fri 8:00-5:00, Sat 8:00-12:00, Sun 9:00-12:00 o Babies seen by [facility] providers o Accepting Medicaid . [facility] at [facility] o Coe-Goins, MD; Gaynell FaceMarshall, MD; Memorial Hospital[facility]re-Louis, MD o [address], [place], [place] [address] o [phone] o Mon-Thur 8:00-5:00 o Babies [facility] by providers at [facility] o Accepting Medicaid . [facility] at [place] Gwenlyn Sarano Bratton, MD; Coe-Goins, MD; Madilyn FiremanHayes, MD; Melvyn NethLewis, MD; List, MD; Lazarus SalinesLott, MD; Gaynell FaceMarshall, MD; Berneda RoseMoran, MD; Flora Lipps'Neal, MD; Beryl MeagerPierre-Louis, MD; Luther RedoPitonzo, MD; Lavonia DraftsScholer, MD; Kellie SimmeringSpangle, MD o [address] [place], [place] [address] o [phone] o Mon-Fri 8:00-5:30, Sat (Oct.-Mar.) 9:00-1:00 o Babies seen by providers at [facility] o Accepting Medicaid o Must fill out new patient packet, available online at [email or web address] . [facility] [address] [facility]) Simone Curiao Friddle, NP; Tiburcio PeaHarris, NP; Tresa EndoKelly, NP; Whitney PostLogan, MD; [place], [place]; Hennie DuosPoth, MD;  Wynne Dustamadoss, MD; Kavin LeechStanton, NP o [address], [place], [place] [address] o [phone] o Mon-Thur 8:00-5:30, Fri 8:00-5:00 o Babies seen by providers at [facility] o Accepting Se [facility]  [place] [phone]) . [facility] o [place], [place]; [place] Auburn[place]ham, MD; Tanya NonesPickard, MD; [place], [place] o [address], [place], [place] [address] o [phone] o Mon-Fri 8:00-5:00 o Babies seen by providers at [facility] o Accepting [facility]   [place] [phone]) . [facility] o SlaughterMasneri, DO; Lenise ArenaMeyers, MD; [place], [place] o [address], [place], [place] [address] o [phone] o Mon-Fri 8:00-5:00 o Babies seen by providers at [facility] o Does NOT accept Medicaid o Limited appointment availability, please call early in hospitalization  . Nature conservation officerLeBauer HealthCare at [facility] o HaysKunedd, DO; RansomMcGowen, MD o [address], [place], [place] [address] o [phone] o Mon-Fri 8:00-5:00 o Babies seen by [facility] providers o Does NOT accept Medicaid . [facility] Lorrine Kino Cameron, MD; Ninetta LightsMacDonald, MD; [place] AcresMicha[place], [place]; ColfaxNayak, MD o [address], [place], [place] [address] o [phone] o Mon-Fri 8:00-5:00 o After hours clinic [facility]([address] [place], [place] [address]) [phone] Mon-Fri 5:00-8:00, Sat 12:00-6:00, Sun 10:00-4:00 o Babies seen by [facility] providers o Accepting Medicaid . [facility] o [address], [place], [place]  [address] o [phone]   Fax - [phone]  [place] [phone]) . Nature conservation officer at [facility], MD o [address], [place], [place] [address] o [phone] o Mon-Fri 8:00-5:00 o Babies seen by [facility] providers o Does NOT accept Medicaid . [facility] - [place] [facility] at [place]) Tomi Likens, MD o [address] [place] [address], [place], [place] [address] o [phone] o Mon-Thur  8:00-7:00, Fri 8:00-5:00, Sat 8:00-12:00 o Babies seen by providers at [facility] o Accepting Medicaid - but does not have vaccinations in office (must be received elsewhere) o Limited availability, please call early in hospitalization  [place] ([phone]) . [facility]  o Wyvonne Lenz, MD o [address] [place] [address] o [phone]  Fax [phone]

## 2020-04-26 ENCOUNTER — Ambulatory Visit (INDEPENDENT_AMBULATORY_CARE_PROVIDER_SITE_OTHER): Payer: Medicaid Other | Admitting: Advanced Practice Midwife

## 2020-04-26 ENCOUNTER — Other Ambulatory Visit: Payer: Self-pay

## 2020-04-26 ENCOUNTER — Other Ambulatory Visit: Payer: Medicaid Other

## 2020-04-26 VITALS — BP 113/75 | HR 105 | Wt 153.6 lb

## 2020-04-26 DIAGNOSIS — Z3400 Encounter for supervision of normal first pregnancy, unspecified trimester: Secondary | ICD-10-CM

## 2020-04-26 DIAGNOSIS — Z3A28 28 weeks gestation of pregnancy: Secondary | ICD-10-CM

## 2020-04-26 MED ORDER — TETANUS-DIPHTH-ACELL PERTUSSIS 5-2.5-18.5 LF-MCG/0.5 IM SUSY
0.5000 mL | PREFILLED_SYRINGE | Freq: Once | INTRAMUSCULAR | Status: AC
  Administered 2020-04-26: 0.5 mL via INTRAMUSCULAR

## 2020-04-26 NOTE — Progress Notes (Signed)
   PRENATAL VISIT NOTE  Subjective:  Laurie [name] is a 19 y.o. G1P0 at [redacted]w[redacted]d being seen today for ongoing prenatal care.  She is currently monitored for the following issues for this low-risk pregnancy and has Supervision of normal first pregnancy, antepartum on their problem list.  Patient reports no complaints.  Contractions: Not present. Vag. Bleeding: None.  Movement: Present. Denies leaking of fluid.   The following portions of the patient's history were reviewed and updated as appropriate: allergies, current medications, past family history, past medical history, past social history, past surgical history and problem list.   Objective:   Vitals:   04/26/20 1127 04/26/20 1128  BP:  113/75  Pulse:  (!) 105  Weight: 153 lb 9.6 oz (69.7 kg) 153 lb 9.6 oz (69.7 kg)    Fetal Status: Fetal Heart Rate (bpm): 145   Movement: Present     General:  Alert, oriented and cooperative. Patient is in no acute distress.  Skin: Skin is warm and dry. No rash noted.   Cardiovascular: Normal heart rate noted  Respiratory: Normal respiratory effort, no problems with respiration noted  Abdomen: Soft, gravid, appropriate for gestational age.  Pain/Pressure: Absent     Pelvic: Cervical exam deferred        Extremities: Normal range of motion.  Edema: None  Mental Status: Normal mood and affect. Normal behavior. Normal judgment and thought content.   Assessment and Plan:  Pregnancy: G1P0 at [redacted]w[redacted]d 1. Supervision of normal first pregnancy, antepartum --Anticipatory guidance about next visits/weeks of pregnancy given. --Leopolds at pt request, fetal position LOA --next visit in 2 weeks in office  2. [redacted] weeks gestation of pregnancy --Reschedule GTT, pt overslept and did not arrive in time for lab   Preterm labor symptoms and general obstetric precautions including but not limited to vaginal bleeding, contractions, leaking of fluid and fetal movement were reviewed in detail with the patient. Please  refer to After Visit Summary for other counseling recommendations.   Return in about 2 weeks (around 05/10/2020).  Future Appointments  Date Time Provider Department Center  05/11/2020  8:15 AM [facility] None    Sharen Counter, CNM

## 2020-04-26 NOTE — Patient Instructions (Signed)
Meet the Provider Zoom Sessions      [facility] for [facility] is now offering FREE monthly 1-hour virtual Zoom sessions for new, current, and prospective patients.        During these sessions, you can:   Learn about our practice, model of care, services   Get answers to questions about pregnancy and birth during COVID   Pick your provider's brain about anything else!    Sessions will be hosted by [employer] for [employer], Producer, television/film/video, Physicians and Midwives          No registration required      2021 Dates:      All at 6pm     October 21st     November 18th   December 16th     January 20th  February 17th    To join one of these meetings, a few minutes before it is set to start:     Copy/paste the link into your web browser:  [email or web address]    OR  Scan the QR code below (open up your camera and point towards QR code; click on tab that pops up on your phone ("zoom")     Third Trimester of Pregnancy The third trimester is from week 28 through week 40 (months 7 through 9). The third trimester is a time when the unborn baby (fetus) is growing rapidly. At the end of the ninth month, the fetus is about 20 inches in length and weighs 6-10 pounds. Body changes during your third trimester Your body will continue to go through many changes during pregnancy. The changes vary from woman to woman. During the third trimester:  Your weight will continue to increase. You can expect to gain 25-35 pounds (11-16 kg) by the end of the pregnancy.  You may begin to get stretch marks on your hips, abdomen, and breasts.  You may urinate more often because the fetus is moving lower into your pelvis and pressing on your bladder.  You may develop or continue to have heartburn. This is caused by increased hormones that slow down muscles in the digestive tract.  You may  develop or continue to have constipation because increased hormones slow digestion and cause the muscles that push waste through your intestines to relax.  You may develop hemorrhoids. These are swollen veins (varicose veins) in the rectum that can itch or be painful.  You may develop swollen, bulging veins (varicose veins) in your legs.  You may have increased body aches in the pelvis, back, or thighs. This is due to weight gain and increased hormones that are relaxing your joints.  You may have changes in your hair. These can include thickening of your hair, rapid growth, and changes in texture. Some women also have hair loss during or after pregnancy, or hair that feels dry or thin. Your hair will most likely return to normal after your baby is born.  Your breasts will continue to grow and they will continue to become tender. A yellow fluid (colostrum) may leak from your breasts. This is the first milk you are producing for your baby.  Your belly button may stick out.  You may notice more swelling in your hands, face, or ankles.  You may have increased tingling or numbness in your hands, arms, and legs. The skin on your belly may also feel numb.  You may feel short of breath because of your expanding uterus.  You may have more problems sleeping. This can  be caused by the size of your belly, increased need to urinate, and an increase in your body's metabolism.  You may notice the fetus "dropping," or moving lower in your abdomen (lightening).  You may have increased vaginal discharge.  You may notice your joints feel loose and you may have pain around your pelvic bone. What to expect at prenatal visits You will have prenatal exams every 2 weeks until week 36. Then you will have weekly prenatal exams. During a routine prenatal visit:  You will be weighed to make sure you and the baby are growing normally.  Your blood pressure will be taken.  Your abdomen will be measured to track  your baby's growth.  The fetal heartbeat will be listened to.  Any test results from the previous visit will be discussed.  You may have a cervical check near your due date to see if your cervix has softened or thinned (effaced).  You will be tested for Group B streptococcus. This happens between 35 and 37 weeks. Your health care provider may ask you:  What your birth plan is.  How you are feeling.  If you are feeling the baby move.  If you have had any abnormal symptoms, such as leaking fluid, bleeding, severe headaches, or abdominal cramping.  If you are using any tobacco products, including cigarettes, chewing tobacco, and electronic cigarettes.  If you have any questions. Other tests or screenings that may be performed during your third trimester include:  Blood tests that check for low iron levels (anemia).  Fetal testing to check the health, activity level, and growth of the fetus. Testing is done if you have certain medical conditions or if there are problems during the pregnancy.  Nonstress test (NST). This test checks the health of your baby to make sure there are no signs of problems, such as the baby not getting enough oxygen. During this test, a belt is placed around your belly. The baby is made to move, and its heart rate is monitored during movement. What is false labor? False labor is a condition in which you feel small, irregular tightenings of the muscles in the womb (contractions) that usually go away with rest, changing position, or drinking water. These are called Braxton Hicks contractions. Contractions may last for hours, days, or even weeks before true labor sets in. If contractions come at regular intervals, become more frequent, increase in intensity, or become painful, you should see your health care provider. What are the signs of labor?  Abdominal cramps.  Regular contractions that start at 10 minutes apart and become stronger and more frequent with  time.  Contractions that start on the top of the uterus and spread down to the lower abdomen and back.  Increased pelvic pressure and dull back pain.  A watery or bloody mucus discharge that comes from the vagina.  Leaking of amniotic fluid. This is also known as your "water breaking." It could be a slow trickle or a gush. Let your health care provider know if it has a color or strange odor. If you have any of these signs, call your health care provider right away, even if it is before your due date. Follow these instructions at home: Medicines  Follow your health care provider's instructions regarding medicine use. Specific medicines may be either safe or unsafe to take during pregnancy.  Take a prenatal vitamin that contains at least 600 micrograms (mcg) of folic acid.  If you develop constipation, try taking a stool  softener if your health care provider approves. Eating and drinking   Eat a balanced diet that includes fresh fruits and vegetables, whole grains, good sources of protein such as meat, eggs, or tofu, and low-fat dairy. Your health care provider will help you determine the amount of weight gain that is right for you.  Avoid raw meat and uncooked cheese. These carry germs that can cause birth defects in the baby.  If you have low calcium intake from food, talk to your health care provider about whether you should take a daily calcium supplement.  Eat four or five small meals rather than three large meals a day.  Limit foods that are high in fat and processed sugars, such as fried and sweet foods.  To prevent constipation: ? Drink enough fluid to keep your urine clear or pale yellow. ? Eat foods that are high in fiber, such as fresh fruits and vegetables, whole grains, and beans. Activity  Exercise only as directed by your health care provider. Most women can continue their usual exercise routine during pregnancy. Try to exercise for 30 minutes at least 5 days a week.  Stop exercising if you experience uterine contractions.  Avoid heavy lifting.  Do not exercise in extreme heat or humidity, or at high altitudes.  Wear low-heel, comfortable shoes.  Practice good posture.  You may continue to have sex unless your health care provider tells you otherwise. Relieving pain and discomfort  Take frequent breaks and rest with your legs elevated if you have leg cramps or low back pain.  Take warm sitz baths to soothe any pain or discomfort caused by hemorrhoids. Use hemorrhoid cream if your health care provider approves.  Wear a good support bra to prevent discomfort from breast tenderness.  If you develop varicose veins: ? Wear support pantyhose or compression stockings as told by your healthcare provider. ? Elevate your feet for 15 minutes, 3-4 times a day. Prenatal care  Write down your questions. Take them to your prenatal visits.  Keep all your prenatal visits as told by your health care provider. This is important. Safety  Wear your seat belt at all times when driving.  Make a list of emergency phone numbers, including numbers for family, friends, the hospital, and police and fire departments. General instructions  Avoid cat litter boxes and soil used by cats. These carry germs that can cause birth defects in the baby. If you have a cat, ask someone to clean the litter box for you.  Do not travel far distances unless it is absolutely necessary and only with the approval of your health care provider.  Do not use hot tubs, steam rooms, or saunas.  Do not drink alcohol.  Do not use any products that contain nicotine or tobacco, such as cigarettes and e-cigarettes. If you need help quitting, ask your health care provider.  Do not use any medicinal herbs or unprescribed drugs. These chemicals affect the formation and growth of the baby.  Do not douche or use tampons or scented sanitary pads.  Do not cross your legs for long periods of  time.  To prepare for the arrival of your baby: ? Take prenatal classes to understand, practice, and ask questions about labor and delivery. ? Make a trial run to the hospital. ? Visit the hospital and tour the maternity area. ? Arrange for maternity or paternity leave through employers. ? Arrange for family and friends to take care of pets while you are in the hospital. ?  Purchase a rear-facing car seat and make sure you know how to install it in your car. ? Pack your hospital bag. ? Prepare the baby's nursery. Make sure to remove all pillows and stuffed animals from the baby's crib to prevent suffocation.  Visit your dentist if you have not gone during your pregnancy. Use a soft toothbrush to brush your teeth and be gentle when you floss. Contact a health care provider if:  You are unsure if you are in labor or if your water has broken.  You become dizzy.  You have mild pelvic cramps, pelvic pressure, or nagging pain in your abdominal area.  You have lower back pain.  You have persistent nausea, vomiting, or diarrhea.  You have an unusual or bad smelling vaginal discharge.  You have pain when you urinate. Get help right away if:  Your water breaks before 37 weeks.  You have regular contractions less than 5 minutes apart before 37 weeks.  You have a fever.  You are leaking fluid from your vagina.  You have spotting or bleeding from your vagina.  You have severe abdominal pain or cramping.  You have rapid weight loss or weight gain.  You have shortness of breath with chest pain.  You notice sudden or extreme swelling of your face, hands, ankles, feet, or legs.  Your baby makes fewer than 10 movements in 2 hours.  You have severe headaches that do not go away when you take medicine.  You have vision changes. Summary  The third trimester is from week 28 through week 40, months 7 through 9. The third trimester is a time when the unborn baby (fetus) is growing  rapidly.  During the third trimester, your discomfort may increase as you and your baby continue to gain weight. You may have abdominal, leg, and back pain, sleeping problems, and an increased need to urinate.  During the third trimester your breasts will keep growing and they will continue to become tender. A yellow fluid (colostrum) may leak from your breasts. This is the first milk you are producing for your baby.  False labor is a condition in which you feel small, irregular tightenings of the muscles in the womb (contractions) that eventually go away. These are called Braxton Hicks contractions. Contractions may last for hours, days, or even weeks before true labor sets in.  Signs of labor can include: abdominal cramps; regular contractions that start at 10 minutes apart and become stronger and more frequent with time; watery or bloody mucus discharge that comes from the vagina; increased pelvic pressure and dull back pain; and leaking of amniotic fluid. This information is not intended to replace advice given to you by your health care provider. Make sure you discuss any questions you have with your health care provider. Document Revised: 09/18/2018 Document Reviewed: 07/03/2016 Elsevier Patient Education  2020 [employer].

## 2020-05-11 ENCOUNTER — Other Ambulatory Visit: Payer: Medicaid Other

## 2020-05-11 ENCOUNTER — Ambulatory Visit (INDEPENDENT_AMBULATORY_CARE_PROVIDER_SITE_OTHER): Payer: Medicaid Other | Admitting: Obstetrics and Gynecology

## 2020-05-11 ENCOUNTER — Other Ambulatory Visit: Payer: Self-pay

## 2020-05-11 VITALS — BP 118/72 | HR 82 | Wt 158.0 lb

## 2020-05-11 DIAGNOSIS — M549 Dorsalgia, unspecified: Secondary | ICD-10-CM

## 2020-05-11 DIAGNOSIS — O99891 Other specified diseases and conditions complicating pregnancy: Secondary | ICD-10-CM

## 2020-05-11 DIAGNOSIS — Z3A30 30 weeks gestation of pregnancy: Secondary | ICD-10-CM | POA: Insufficient documentation

## 2020-05-11 DIAGNOSIS — Z3400 Encounter for supervision of normal first pregnancy, unspecified trimester: Secondary | ICD-10-CM | POA: Diagnosis not present

## 2020-05-11 MED ORDER — COMFORT FIT MATERNITY SUPP MED MISC: 1.0000 "application " | 0 refills | Status: DC | PRN

## 2020-05-11 NOTE — Progress Notes (Signed)
ROB   2 Hr GTT.  T-Dap Received 04/26/20  CC: None

## 2020-05-11 NOTE — Progress Notes (Signed)
   PRENATAL VISIT NOTE  Subjective:  Laurie [name] is a 19 y.o. G1P0 at [redacted]w[redacted]d being seen today for ongoing prenatal care.  She is currently monitored for the following issues for this low-risk pregnancy and has Supervision of normal first pregnancy, antepartum; [redacted] weeks gestation of pregnancy; and Back pain affecting pregnancy in third trimester on their problem list.  Patient doing well with no acute concerns today. She reports backache.  Contractions: Not present. Vag. Bleeding: None.  Movement: Present. Denies leaking of fluid.   The following portions of the patient's history were reviewed and updated as appropriate: allergies, current medications, past family history, past medical history, past social history, past surgical history and problem list. Problem list updated.  Objective:   Vitals:   05/11/20 0844  BP: 118/72  Pulse: 82  Weight: 158 lb (71.7 kg)    Fetal Status: Fetal Heart Rate (bpm): 146 Fundal Height: 30 cm Movement: Present     General:  Alert, oriented and cooperative. Patient is in no acute distress.  Skin: Skin is warm and dry. No rash noted.   Cardiovascular: Normal heart rate noted  Respiratory: Normal respiratory effort, no problems with respiration noted  Abdomen: Soft, gravid, appropriate for gestational age.  Pain/Pressure: Absent     Pelvic: Cervical exam deferred        Extremities: Normal range of motion.  Edema: None  Mental Status:  Normal mood and affect. Normal behavior. Normal judgment and thought content.   Assessment and Plan:  Pregnancy: G1P0 at [redacted]w[redacted]d  1. Supervision of normal first pregnancy, antepartum Routine care - Glucose Tolerance, 2 Hours w/1 Hour - CBC - RPR - HIV Antibody (routine testing w rflx)  2. [redacted] weeks gestation of pregnancy   3. Back pain affecting pregnancy in third trimester Advised tylenol, rest and warm heat, will get belly band  - Elastic Bandages & Supports (COMFORT FIT MATERNITY SUPP MED) MISC; 1 application  by Does not apply route as needed.  Dispense: 1 each; Refill: 0  Preterm labor symptoms and general obstetric precautions including but not limited to vaginal bleeding, contractions, leaking of fluid and fetal movement were reviewed in detail with the patient.  Please refer to After Visit Summary for other counseling recommendations.   Return in about 2 weeks (around 05/25/2020) for ROB, in person.   Mariel Aloe, MD

## 2020-05-11 NOTE — Patient Instructions (Signed)

## 2020-05-12 LAB — HIV ANTIBODY (ROUTINE TESTING W REFLEX): HIV Screen 4th Generation wRfx: NONREACTIVE

## 2020-05-12 LAB — CBC
Hematocrit: 29 % — ABNORMAL LOW (ref 34.0–46.6)
Hemoglobin: 9.4 g/dL — ABNORMAL LOW (ref 11.1–15.9)
MCH: 28.7 pg (ref 26.6–33.0)
MCHC: 32.4 g/dL (ref 31.5–35.7)
MCV: 88 fL (ref 79–97)
Platelets: 255 10*3/uL (ref 150–450)
RBC: 3.28 x10E6/uL — ABNORMAL LOW (ref 3.77–5.28)
RDW: 12.9 % (ref 11.7–15.4)
WBC: 12.2 10*3/uL — ABNORMAL HIGH (ref 3.4–10.8)

## 2020-05-12 LAB — RPR: RPR Ser Ql: NONREACTIVE

## 2020-05-12 LAB — GLUCOSE TOLERANCE, 2 HOURS W/ 1HR
Glucose, 1 hour: 131 mg/dL (ref 65–179)
Glucose, 2 hour: 108 mg/dL (ref 65–152)
Glucose, Fasting: 82 mg/dL (ref 65–91)

## 2020-05-25 ENCOUNTER — Other Ambulatory Visit: Payer: Self-pay

## 2020-05-25 ENCOUNTER — Ambulatory Visit (INDEPENDENT_AMBULATORY_CARE_PROVIDER_SITE_OTHER): Payer: Medicaid Other | Admitting: Nurse Practitioner

## 2020-05-25 VITALS — BP 119/73 | HR 102 | Wt 162.0 lb

## 2020-05-25 DIAGNOSIS — Z3A32 32 weeks gestation of pregnancy: Secondary | ICD-10-CM

## 2020-05-25 DIAGNOSIS — Z3400 Encounter for supervision of normal first pregnancy, unspecified trimester: Secondary | ICD-10-CM

## 2020-05-25 NOTE — Progress Notes (Signed)
ROB   CC: None    

## 2020-05-25 NOTE — Patient Instructions (Signed)
Check out Covid vaccine info here:   [email or web address]  7 Things You Should Know About the COVID-19 Vaccines  Here are seven facts you should know before taking your shot:  1.  No serious side effects were reported in clinical trials. Temporary reactions after receiving the vaccine may include a sore arm, headache, feeling tired and achy for a day or two or, in some cases, fever. In most cases, these reactions are good signs that your body is building protection.   2.  Scientists had a head start. They are built on decades of research on vaccines for similar viruses. A big investment of resources and focus made sure they were created without skipping any steps in development, testing, or clinical trials.  3. You cannot get COVID-19 from the vaccine. The vaccine gives your body instructions to make a protein that safely teaches you to make germ-fighting antibodies to fight the real COVID-19.  4.The vaccine protects against the Delta variant. The Delta variant, which is now predominant in [place], is much more contagious than the original virus. Vaccines continue to be remarkably effective in reducing risk of severe disease, hospitalization, and death, even against the Delta variant.  5. A hundred million people in the [place] have already received their COVID-19 vaccine.  6. It works. And once you're fully vaccinated you're protected. The vaccines are proven to help prevent COVID-19 and are effective in preventing hospitalization and death.   7. The vaccine does not affect fertility. Vaccination for those who are pregnant or wanting to become pregnant is recommended by the [employer] of Obstetricians and Gynecologists (ACOG), the Society for Maternal-Fetal Medicine (SMFM), the American Society for Reproductive Medicine (ASRM), and the Society for Female Reproduction and Urology.    AREA PEDIATRIC/FAMILY PRACTICE PHYSICIANS  Central/[place]outheast [place] ([phone]) . [facility] Melodie Bouillono Chambliss, MD; Lum BabeEniola, MD; Sheffield SliderHale, MD; Leveda AnnaHensel, MD; McDiarmid, MD; Jerene BearsMcIntyer, MD; Jennette KettleNeal, MD; Gwendolyn GrantWalden, MD o [address] [place], [place] [address] o [phone] o Mon-Fri 8:30-12:30, 1:30-5:00 o Providers come to see babies at [facility] o Accepting Medicaid . [facility] o Limited providers who accept newborns: Docia ChuckKoirala, MD; Kateri PlummerMorrow, MD; Paulino RilyWolters, MD o [address], [place], [place] [address] o [phone] o Mon-Fri 8:00-5:30 o Babies seen by providers at [facility] o Does NOT accept Medicaid o Please call early in hospitalization for appointment (limited availability)  . [facility] Fatima Sangero Mulberry, MD o [address] [place], [place] [address] o [phone] o Mon, Tue, Thur, Fri 8:30-5:00, Wed 10:00-7:00 (closed 1-2pm) o Babies seen by [facility] providers o Accepting Medicaid . Donnie Coffinubin - Pediatrician Fae Pippino Rubin, MD o [address], [place], [place] [address] o [phone] o Mon-Fri 8:30-5:00, Sat 8:30-12:00 o Provider comes to see babies at [facility] o Accepting Medicaid o Must have been referred from current patients or contacted office prior to delivery . [facility] Leotis Paino Brown, MD; Ave Filterhandler, MD; Luna FuseEttefagh, MD; Kennedy BuckerGrant, MD; Konrad DoloresLester, MD; Kathlene NovemberMcCormick, MD; Jenne CampusMcQueen, MD; Lubertha SouthProse, MD; Wynetta EmerySimha, MD; Duffy RhodyStanley, MD; Gerre CouchStryffeler, NP; Shirl Harrisebben, NP o [address], [place], [place] [address] o [phone] o Mon, Tue, Thur, Fri 8:30-5:30, Wed 9:30-5:30, Sat 8:30-12:30 o Babies seen by [facility] providers o Accepting Medicaid o Only accepting infants of first-time parents or siblings of current patients [facility] discharge coordinator will make follow-up appointment . Cyril MourningJack Amos o  [address] [place], [place]  [address] o [phone]   Fax - [phone] . [facility] o [address], [place], [place]  [address] o Phone - [phone]   Fax - [phone] . Lucio Edward o [address], [place], [place]  [address] o [phone]  East/[place]ortheast [place] [phone]) . [facility] Jorge Mandril, MD; Alita Chyle, MD; Princella Ion, MD; MD; Earlene Plater, MD; Jamesetta Orleans, MD; Alvera Novel, MD; Clarene Duke, MD; Rana Snare, MD; Carmon Ginsberg, MD; Alinda Money, MD; Hosie Poisson, MD; Mayford Knife, MD o [address] [place], [place] [address] o [phone] o Mon-Fri 8:30-5:00 (extended evenings Mon-Thur as needed), Sat-Sun 10:00-1:00 o Providers come to see babies at [facility] o Accepting Medicaid for families of first-time babies and families with all children in the household age 27 and under. Must register with office prior to making appointment (M-F only). [facility] Odella Aquas, NP; Lynelle Doctor, MD; Susann Givens, MD; [place], [place] o [address], [place], [place] [address] o [phone] o Mon-Fri 8:00-5:00 o Babies seen by providers at [facility] o Does NOT accept Medicaid/Commercial Insurance Only . [facility] [place] [facility])  Suzette Battiest, MD; Zachery Dauer, MD; Stefan Church, MD; Sabino Dick, MD; Quitman Livings, MD; Farris Has, MD; Gaynell Face, MD; Betha Loa, MD; Colon Flattery, MD; Clifton James, MD o [address] [place], [place] [address] o [phone] o Mon-Fri 8:30-5:30, Sat (Oct.-Mar.) 9:00-1:00 o Babies seen by providers at [facility] o Accepting [facility] [phone]) . [facility] Dimitri, MD; Sheliah Hatch, MD o [address], [place], [place] [address] o [phone] o Mon-Fri 8:30-5:00, Sat 8:30-12:00 o Providers come to see babies at [facility] o Does NOT accept Medicaid . [facility] Cindy Hazy, [place]; [place], MD; [place], [place]; Wynelle Link, MD; Azucena Cecil, MD o [address], [place], [place] [address] o [phone] o Mon-Fri 8:00-5:00 o Babies seen by providers at [facility] o Does NOT accept Medicaid o Only accepting babies of parents who are patients o Please call early in hospitalization for appointment (limited availability) . [facility] Lamar Benes, MD; Abran Cantor, MD; Early Osmond, MD; Cherre Huger, NP; Hyacinth Meeker, MD; Dwan Bolt, MD; Jarold Motto, NP; Dario Guardian, MD; Talmage Nap, MD; Maisie Fus, MD; Pricilla Holm, MD; Tama High, MD o [address] [place], [place], [place] [address] o [phone] o Mon-Fri 8:00-5:00, Sat 9:00-12:00 o Providers come to see babies at [facility] o Does NOT accept [facility] [phone]) . [facility] o Limited providers accepting new patients: Drema Pry, NP; [place], PA o [address] [place], [place] [address] o [phone] o Mon-Fri 8:00-5:00 o Babies seen by providers at [facility] o Does NOT accept Medicaid o Only accepting babies of parents who are patients o Please call early in hospitalization for appointment (limited availability) . [facility] Luan Pulling, MD; Nash Dimmer, MD o [address] [place], [place] [address] o [phone] (press 1 to schedule appointment) o Mon-Fri 8:00-5:00 o Providers come to see babies at [facility] o Does NOT accept Medicaid . [facility] Cristino Martes, MD o [address] [place], [place] [address] o [phone] o Mon-Fri 8:30-5:00 (lunch 12:30-1:00), extended hours by appointment only Wed 5:00-6:30 o Babies seen by [facility] providers o Accepting Medicaid . [facility], MD; [place], MD; Hassan Rowan, MD o [address] [place], [place], [place] [address] o [phone] o Mon-Fri 8:00-5:00 o Babies seen by [facility] providers o Does NOT accept Medicaid .  Nature conservation officer at [place] [address] Elsworth Soho, MD; Durene Cal, MD; [place], DO o [address] [place], [place] [address] o [phone] o Mon-Fri 8:00-5:00 o Babies seen by [facility] providers o Does NOT accept  Medicaid . [facility] o [place], [place]; [place], [place]; [place], NP; Avis Epley, MD; Vonna Kotyk, MD; Clance Boll, MD; Stevphen Rochester, NP; Arvilla Market, NP; Ann Maki, NP; Otis Dials, NP; Vaughan [place]asta, MD; [place], MD o [address] [place], [place] [address] o [phone] o Mon-Fri 8:30-5:00, Sat 10:00-1:00 o Providers come to see babies at [facility] o Does NOT accept Medicaid o Free prenatal information session Tuesdays at 4:45pm . [facility] Luna Kitchens, MD; [place], [place]; [place], [place]; [place], [place] o [address] [place] [address] o [phone] o Mon-Fri 7:30-5:30 o Babies seen by [facility] providers . [facility] o [address], [place], [place]  [address] o [phone]   Fax - [phone]  [place] [phone] & [phone]) . [facility] Alphonsa Overall, MD o [address] [place], [place] [address] o [phone] o Mon-Thur 8:00-6:00 o Providers come to see babies at [facility] o Accepting Medicaid . [facility] Zenon Mayo, NP; Cyndia Bent, MD; [place], [place]; [place], [place] o [address] [place], [place] [address] o [phone] o Mon-Thur 7:30-7:30, Fri 7:30-4:30 o Babies seen by [facility] providers o Accepting Medicaid . [facility] Cheryle Horsfall, MD; Janene Harvey, NP; Vonita Moss, MD o [address], [place], [place] [address] o [phone] o Mon-Fri 8:30-5:00, Sat 8:30-12:00 o Providers come to see babies at [facility] o Accepting Medicaid o Must have "Meet & Greet" appointment at office prior to delivery . [facility] [place] [facility]) Llana Aliment, MD; Earlene Plater, MD; Lucretia Roers, MD o [address], [place], [place] [address] o [phone] o Mon-Wed 8:00-6:00, Thur-Fri 8:00-5:00, Sat 9:00-12:00 o Providers come to see babies at [facility] o Does NOT accept Medicaid o Only accepting siblings of current  patients . [facility]  o [address], [place], [place]  [address] o [phone]   Fax - [phone] . [facility] o [phone] [address], [place], [place]  [address] o [phone]   Fax - [phone]  [place] [phone] & [phone]) . Nature conservation officer at [facility] o [place], DO; [place], DO o [address] [place], [place] [address] o [phone] o Mon-Fri 7:00-5:00 o Babies seen by [facility] providers o Does NOT accept Medicaid . [facility] Ellis Savage, MD; [place], [place]; [place], [place] o [address], [place], [place] [address] o [phone] o Mon-Fri 8:00-5:00 o Babies seen by [facility] providers o Accepting Medicaid . [facility] - [address] Franne Forts, MD; [place], [place]; [place], NP; [place], [place] o [address] [place], [place], [place] [address] o [phone] o Mon-Fri 8:00-5:00 o Babies seen by providers at [facility] o Accepting [facility] [phone]) . [facility] [place], [place] o [address] [place], [place] [address] o [phone] o Mon-Fri 8:00-5:00 o Babies seen by [facility] providers o Does NOT accept Medicaid o Limited availability, please call early in hospitalization to schedule follow-up . [facility] Jolee Ewing, PA; Eddie Candle, MD; [place], MD; [place], [place]; Constance Goltz, MD; [place], [place] o [facility] [facility] [address], [place], [place] [address] o [phone]  o Mon-Fri 8:30-5:00, Sat 9:00-12:00 o Babies seen by providers at [facility] o Accepting Medicaid o Please register online then schedule online or call office o [email or web address] . [facility]) Samuella Bruin, NP; Lucianne Muss, MD; Lanier Clam, PA o [address], [place], [place]  [address] o [phone] o Mon-Fri 8:00-5:00 o Babies seen by providers at [facility] o Accepting Medicaid . [facility] [place] ([facility] [employer] [facility]) Sharin Mons, MD; Reed Breech, NP; Shelva Majestic, MD o [address], [place], [place] [address] o [phone] o Mon-Fri 8:00-5:30, Sat&Sun by appointment (phones open at 8:30) o Babies seen by [facility] providers o Accepting Medicaid o Must be a first-time baby or sibling of current patient . [facility] [address], [place], [place]  [address] o [phone]   Fax - [phone]  [place] [phone] & [phone]) . [facility] Medicine o [place], [place]; [place], [place]; Dimple Casey, MD; [place], [place]; Carolyne Fiscal, MD o [address] [place], [place] [address] o [phone] o Mon-Thur 8:00-7:00, Fri 8:00-5:00, Sat 8:00-12:00, Sun 9:00-12:00 o Babies seen by [facility] providers o Accepting Medicaid . [facility] at [facility], MD; Gaynell Face, MD; [facility], MD o [address], [place], [place] [address] o [phone] o Mon-Thur 8:00-5:00 o Babies seen by providers at [facility] o Accepting Medicaid . [facility] at [place] Gwenlyn Saran, MD; [place], MD; Madilyn Fireman, MD; Melvyn Neth, MD; List, MD; Lazarus Salines, MD; Gaynell Face, MD; Berneda Rose, MD; Flora Lipps, MD; Beryl Meager, MD; Luther Redo, MD; Lavonia Drafts, MD; Kellie Simmering, MD o [address] [place] [place], [place] [address] o [phone] o Mon-Fri 8:00-5:30, Sat (Oct.-Mar.) 9:00-1:00 o Babies seen by providers at [facility] o Accepting Medicaid o Must fill out new patient packet, available online at [email or web address] . [facility] - [place] [facility] at [facility]) Simone Curia, NP; Tiburcio Pea, NP; Tresa Endo, NP; Whitney Post, MD; [place], [place]; Hennie Duos, MD; Wynne Dust, MD; Kavin Leech, NP o [address], [place], [place]  [address] o [phone] o Mon-Thur 8:00-5:30, Fri 8:00-5:00 o Babies seen by providers at [facility] o Accepting [facility] [phone]) . [facility] o [place], [place]; [place], MD; Tanya Nones, MD; [place], [place] o [address] [place], [place] [address] o [phone] o Mon-Fri 8:00-5:00 o Babies seen by providers at [facility] o Accepting [facility] [phone]) . [facility] at [facility] o [place], DO; Lenise Arena, MD; [place], [place] o [address], [place], [place] [address] o [phone] o Mon-Fri 8:00-5:00 o Babies seen by providers at [facility] o Does NOT accept Medicaid o Limited appointment availability, please call early in hospitalization  . Nature conservation officer at [facility] o [place], DO; [place], MD o [address], [place], [place] [address] o [phone] o Mon-Fri 8:00-5:00 o Babies seen by [facility] providers o Does NOT accept Medicaid . [facility] Lorrine Kin, MD; Ninetta Lights, MD; [place], [place]; [place], MD o [address] [facility] [address], [place], [place] [address] o [phone] o Mon-Fri 8:00-5:00 o After hours clinic [facility] [place], [place] [address]) [phone] Mon-Fri 5:00-8:00, Sat 12:00-6:00, Sun 10:00-4:00 o Babies seen by [facility] providers o Accepting Medicaid . [facility] at [facility] o [phone]  [place] [facility] [address] [place], [place]  [address] o [phone]   Fax - [phone]  [place] [phone]) . Nature conservation officer at [facility], MD o [address] [place], [place], [place] [address] o [phone] o Mon-Fri 8:00-5:00 o Babies seen by [facility] providers o Does NOT accept Medicaid . [facility] - [place] [facility]) Tomi Likens, MD o [address] [place] [address], [place], [place] [address] o [phone] o Mon-Thur 8:00-7:00, Fri 8:00-5:00, Sat 8:00-12:00 o Babies seen by providers at  [facility] o Accepting Medicaid - but does not have vaccinations in office (must be received elsewhere) o Limited availability, please call early in hospitalization  [place] ([address]) . [facility]  o Wyvonne Lenz, MD o [address] [place] [address] o [phone]  Fax [phone]

## 2020-05-26 NOTE — Progress Notes (Signed)
    Subjective:  Laurie [name] is a 19 y.o. G1P0 at [redacted]w[redacted]d being seen today for ongoing prenatal care.  She is currently monitored for the following issues for this low-risk pregnancy and has Supervision of normal first pregnancy, antepartum; [redacted] weeks gestation of pregnancy; and Back pain affecting pregnancy in third trimester on their problem list.  Patient reports no complaints.  Contractions: Not present. Vag. Bleeding: None.  Movement: Present. Denies leaking of fluid.   The following portions of the patient's history were reviewed and updated as appropriate: allergies, current medications, past family history, past medical history, past social history, past surgical history and problem list. Problem list updated.  Objective:   Vitals:   05/25/20 1117  BP: 119/73  Pulse: (!) 102  Weight: 162 lb (73.5 kg)    Fetal Status: Fetal Heart Rate (bpm): 156 Fundal Height: 32 cm Movement: Present     General:  Alert, oriented and cooperative. Patient is in no acute distress.  Skin: Skin is warm and dry. No rash noted.   Cardiovascular: Normal heart rate noted  Respiratory: Normal respiratory effort, no problems with respiration noted  Abdomen: Soft, gravid, appropriate for gestational age. Pain/Pressure: Absent     Pelvic:  Cervical exam deferred        Extremities: Normal range of motion.  Edema: None  Mental Status: Normal mood and affect. Normal behavior. Normal judgment and thought content.   Urinalysis:      Assessment and Plan:  Pregnancy: G1P0 at [redacted]w[redacted]d  1. Supervision of normal first pregnancy, antepartum Reviewed taking childbirth and breastfeeding classes Reviewed selecting pediatrician - list supplied again today Not interested in Covid vaccine at this time.  Advised to notify office of the dates of vaccine if she decides to take the vaccine. Advised to check BP at home weekly.  Reviewed 140/90 (either value) as too high.  Does not have babyscripts at this time - will resend  email.   Preterm labor symptoms and general obstetric precautions including but not limited to vaginal bleeding, contractions, leaking of fluid and fetal movement were reviewed in detail with the patient. Please refer to After Visit Summary for other counseling recommendations.  Return in about 2 weeks (around 06/08/2020) for in person ROB.  Nolene Bernheim, RN, MSN, NP-BC Nurse Practitioner, [facility] for [employer], [facility] 05/26/2020 7:51 AM

## 2020-06-09 ENCOUNTER — Ambulatory Visit (INDEPENDENT_AMBULATORY_CARE_PROVIDER_SITE_OTHER): Payer: Medicaid Other | Admitting: Obstetrics & Gynecology

## 2020-06-09 ENCOUNTER — Other Ambulatory Visit: Payer: Self-pay

## 2020-06-09 ENCOUNTER — Encounter: Payer: Self-pay | Admitting: Obstetrics & Gynecology

## 2020-06-09 DIAGNOSIS — Z3400 Encounter for supervision of normal first pregnancy, unspecified trimester: Secondary | ICD-10-CM

## 2020-06-09 NOTE — Progress Notes (Signed)
   PRENATAL VISIT NOTE  Subjective:  Laurie [name] is a 19 y.o. G1P0 at [redacted]w[redacted]d being seen today for ongoing prenatal care.  She is currently monitored for the following issues for this low-risk pregnancy and has Supervision of normal first pregnancy, antepartum and Back pain affecting pregnancy in third trimester on their problem list.  Patient reports no complaints.  Contractions: Not present. Vag. Bleeding: None.  Movement: Present. Denies leaking of fluid.   The following portions of the patient's history were reviewed and updated as appropriate: allergies, current medications, past family history, past medical history, past social history, past surgical history and problem list.   Objective:   Vitals:   06/09/20 1354  BP: 121/73  Pulse: (!) 103  Weight: 167 lb (75.8 kg)    Fetal Status: Fetal Heart Rate (bpm): 150   Movement: Present     General:  Alert, oriented and cooperative. Patient is in no acute distress.  Skin: Skin is warm and dry. No rash noted.   Cardiovascular: Normal heart rate noted  Respiratory: Normal respiratory effort, no problems with respiration noted  Abdomen: Soft, gravid, appropriate for gestational age.  Pain/Pressure: Absent     Pelvic: Cervical exam deferred        Extremities: Normal range of motion.  Edema: None  Mental Status: Normal mood and affect. Normal behavior. Normal judgment and thought content.   Assessment and Plan:  Pregnancy: G1P0 at [redacted]w[redacted]d 1. Supervision of normal first pregnancy, antepartum Routine prenatal care  Preterm labor symptoms and general obstetric precautions including but not limited to vaginal bleeding, contractions, leaking of fluid and fetal movement were reviewed in detail with the patient. Please refer to After Visit Summary for other counseling recommendations.   Return in about 2 weeks (around 06/23/2020).  Future Appointments  Date Time Provider Department Center  06/23/2020 11:15 AM Sharyon Cable, CNM [facility]  None    Scheryl Darter, MD

## 2020-06-09 NOTE — Progress Notes (Signed)
Pt presents for c/o low abdominal pain, denies LOF, VB

## 2020-06-11 NOTE — L&D Delivery Note (Addendum)
OB/GYN Faculty Practice Delivery Note  Laurie [name] is a 20 y.o. G1P0 s/p SVD at [redacted]w[redacted]d. She was admitted for active labor.   ROM: 1h 20m with clear/yellow fluid  GBS Status: negative  Maximum Maternal Temperature: 99.1  Labor Progress: Patient's initial SVE 3cm early 2/2. Patient progressed well on Pitocin, and after AROM progressed from 7cm to completely dilated within a couple of hours.   Delivery Date/Time: 07/13/20 Delivery: Called to room and patient was complete and pushing. Head delivered ROA. 1 loose nuchal present which was reduced after baby delivered, as well as 1 knot. Shoulder and body delivered in usual fashion. Infant with spontaneous cry, placed on mother's abdomen, dried and stimulated. Cord clamped x 2 after 1-minute delay, and cut by FOB under my direct supervision. Cord blood drawn. Placenta delivered spontaneously with gentle cord traction. Fundus firm with massage and Pitocin. Labia, perineum, vagina, and cervix were inspected, 2nd degree perineal and bilateral periurethral lacerations that were repaired with 3.0 and 4.0 vicryl sutures.   Placenta: complete, three vessel cord appreciated Complications: none Lacerations: 2nd degree perineal and bilateral periurethral, s/p repair  EBL: 350 mL Analgesia: Epidural   Infant:  APGARs 8,9  3155g  Franchot Erichsen, DO PGY-1 [facility] for [employer], [facility]    I attest that I was gowned and gloved for this delivery and delivery of placenta and repair, and I agree with the above documentation and findings.

## 2020-06-23 ENCOUNTER — Telehealth (INDEPENDENT_AMBULATORY_CARE_PROVIDER_SITE_OTHER): Payer: Medicaid Other | Admitting: Certified Nurse Midwife

## 2020-06-23 ENCOUNTER — Encounter: Payer: Self-pay | Admitting: Certified Nurse Midwife

## 2020-06-23 ENCOUNTER — Other Ambulatory Visit: Payer: Self-pay

## 2020-06-23 ENCOUNTER — Other Ambulatory Visit: Payer: Medicaid Other

## 2020-06-23 ENCOUNTER — Other Ambulatory Visit (HOSPITAL_COMMUNITY)
Admission: RE | Admit: 2020-06-23 | Discharge: 2020-06-23 | Disposition: A | Discharge status: Home / Self Care | Payer: Medicaid Other | Source: Ambulatory Visit | Attending: Certified Nurse Midwife | Admitting: Certified Nurse Midwife

## 2020-06-23 VITALS — BP 139/98 | HR 77

## 2020-06-23 DIAGNOSIS — Z3A36 36 weeks gestation of pregnancy: Secondary | ICD-10-CM | POA: Insufficient documentation

## 2020-06-23 DIAGNOSIS — Z3400 Encounter for supervision of normal first pregnancy, unspecified trimester: Secondary | ICD-10-CM

## 2020-06-23 DIAGNOSIS — O99891 Other specified diseases and conditions complicating pregnancy: Secondary | ICD-10-CM

## 2020-06-23 DIAGNOSIS — R0981 Nasal congestion: Secondary | ICD-10-CM

## 2020-06-23 DIAGNOSIS — R030 Elevated blood-pressure reading, without diagnosis of hypertension: Secondary | ICD-10-CM

## 2020-06-23 NOTE — Progress Notes (Signed)
OBSTETRICS PRENATAL VIRTUAL VISIT ENCOUNTER NOTE  Provider location: Center for [facility] at [facility]   I connected with Laurie [name] on 06/23/20 at 11:05 AM EST by MyChart Video Encounter at home and verified that I am speaking with the correct person using two identifiers.   I discussed the limitations, risks, security and privacy concerns of performing an evaluation and management service virtually and the availability of in person appointments. I also discussed with the patient that there may be a patient responsible charge related to this service. The patient expressed understanding and agreed to proceed. Subjective:  Laurie [name] is a 19 y.o. G1P0 at [redacted]w[redacted]d being seen today for ongoing prenatal care.  She is currently monitored for the following issues for this low-risk pregnancy and has Supervision of normal first pregnancy, antepartum and Back pain affecting pregnancy in third trimester on their problem list.  Patient reports congestion, cough and sore throat.  Contractions: Irritability. Vag. Bleeding: None.  Movement: Present. Denies any leaking of fluid.   The following portions of the patient's history were reviewed and updated as appropriate: allergies, current medications, past family history, past medical history, past social history, past surgical history and problem list.   Objective:   Vitals:   06/23/20 0940  BP: (!) 139/98  Pulse: 77    Fetal Status:     Movement: Present     General:  Alert, oriented and cooperative. Patient is in no acute distress.  Respiratory: Normal respiratory effort, no problems with respiration noted  Mental Status: Normal mood and affect. Normal behavior. Normal judgment and thought content.  Rest of physical exam deferred due to type of encounter  Imaging: No results found.  Assessment and Plan:  Pregnancy: G1P0 at [redacted]w[redacted]d 1. Supervision of normal first pregnancy, antepartum - Anticipatory guidance on upcoming appointment  -  Consult with Dr Jolayne Panther in the office who recommends bringing patient into office at end of day for labs due to elevated BP  - Will obtain GBS swab at that time   2. Head congestion - head congestion, cough and sore throat over the course of this week , recommended to patient to go get testing for COVID  - educated and discussed safe medications during pregnancy   3. [redacted] weeks gestation of pregnancy  4. Elevated blood pressure reading without diagnosis of hypertension - Initial BP taken this morning around 0930 elevated of 139/98 - encouraged patient to take BP again while on mychart with patient, BP 133/78 - educated and discussed PEC precautions and reasons to present to MAU/call office  - Patient scheduled to come into office at end of day today for [facility] labs, encouraged patient to take BP regularly    Preterm labor symptoms and general obstetric precautions including but not limited to vaginal bleeding, contractions, leaking of fluid and fetal movement were reviewed in detail with the patient. I discussed the assessment and treatment plan with the patient. The patient was provided an opportunity to ask questions and all were answered. The patient agreed with the plan and demonstrated an understanding of the instructions. The patient was advised to call back or seek an in-person office evaluation/go to MAU at [facility] for any urgent or concerning symptoms. Please refer to After Visit Summary for other counseling recommendations.   I provided 12 minutes of face-to-face time during this encounter.  Return in about 11 days (around 07/04/2020) for LROB, virtual.  Future Appointments  Date Time Provider Department Center  06/23/2020  4:15 PM [facility] None    Sharyon Cable, CNM [facility] [employer], [facility]

## 2020-06-23 NOTE — Addendum Note (Signed)
Addended by: Sharyon Cable on: 06/23/2020 04:26 PM   Modules accepted: Orders

## 2020-06-23 NOTE — Progress Notes (Signed)
I connected with  Laurie [name] on 06/23/20 by a video enabled telemedicine application and verified that I am speaking with the correct person using two identifiers.   I discussed the limitations of evaluation and management by telemedicine. The patient expressed understanding and agreed to proceed.  PATIENT HOME PROVIDER [facility]    [facility],  C/o congestion, cough, NV.  Denies fever, chills.

## 2020-06-23 NOTE — Patient Instructions (Signed)

## 2020-06-23 NOTE — Addendum Note (Signed)
Addended by: Sharyon Cable on: 06/23/2020 04:53 PM   Modules accepted: Orders

## 2020-06-24 LAB — COMPREHENSIVE METABOLIC PANEL
ALT: 7 IU/L (ref 0–32)
AST: 12 IU/L (ref 0–40)
Albumin/Globulin Ratio: 1.5 (ref 1.2–2.2)
Albumin: 3.6 g/dL — ABNORMAL LOW (ref 3.9–5.0)
Alkaline Phosphatase: 133 IU/L — ABNORMAL HIGH (ref 44–121)
BUN/Creatinine Ratio: 8 — ABNORMAL LOW (ref 9–23)
BUN: 4 mg/dL — ABNORMAL LOW (ref 6–20)
Bilirubin Total: 0.2 mg/dL (ref 0.0–1.2)
CO2: 22 mmol/L (ref 20–29)
Calcium: 8.9 mg/dL (ref 8.7–10.2)
Chloride: 105 mmol/L (ref 96–106)
Creatinine, Ser: 0.48 mg/dL — ABNORMAL LOW (ref 0.57–1.00)
GFR calc Af Amer: 162 mL/min/{1.73_m2} (ref >59)
GFR calc non Af Amer: 141 mL/min/{1.73_m2} (ref >59)
Globulin, Total: 2.4 g/dL (ref 1.5–4.5)
Glucose: 100 mg/dL — ABNORMAL HIGH (ref 65–99)
Potassium: 3.2 mmol/L — ABNORMAL LOW (ref 3.5–5.2)
Sodium: 141 mmol/L (ref 134–144)
Total Protein: 6 g/dL (ref 6.0–8.5)

## 2020-06-24 LAB — CBC
Hematocrit: 30 % — ABNORMAL LOW (ref 34.0–46.6)
Hemoglobin: 9.9 g/dL — ABNORMAL LOW (ref 11.1–15.9)
MCH: 27.6 pg (ref 26.6–33.0)
MCHC: 33 g/dL (ref 31.5–35.7)
MCV: 84 fL (ref 79–97)
Platelets: 241 10*3/uL (ref 150–450)
RBC: 3.59 x10E6/uL — ABNORMAL LOW (ref 3.77–5.28)
RDW: 14.4 % (ref 11.7–15.4)
WBC: 10.8 10*3/uL (ref 3.4–10.8)

## 2020-06-24 LAB — PROTEIN / CREATININE RATIO, URINE
Creatinine, Urine: 170.6 mg/dL
Protein, Ur: 37.9 mg/dL
Protein/Creat Ratio: 222 mg/g creat — ABNORMAL HIGH (ref 0–200)

## 2020-06-27 LAB — CULTURE, BETA STREP (GROUP B ONLY): Strep Gp B Culture: NEGATIVE

## 2020-06-27 LAB — CERVICOVAGINAL ANCILLARY ONLY
Chlamydia: NEGATIVE
Comment: NEGATIVE
Comment: NORMAL
Neisseria Gonorrhea: NEGATIVE

## 2020-07-04 ENCOUNTER — Telehealth (INDEPENDENT_AMBULATORY_CARE_PROVIDER_SITE_OTHER): Payer: Medicaid Other | Admitting: Advanced Practice Midwife

## 2020-07-04 DIAGNOSIS — Z3400 Encounter for supervision of normal first pregnancy, unspecified trimester: Secondary | ICD-10-CM

## 2020-07-04 DIAGNOSIS — R0981 Nasal congestion: Secondary | ICD-10-CM

## 2020-07-04 DIAGNOSIS — R030 Elevated blood-pressure reading, without diagnosis of hypertension: Secondary | ICD-10-CM

## 2020-07-04 DIAGNOSIS — O99891 Other specified diseases and conditions complicating pregnancy: Secondary | ICD-10-CM

## 2020-07-04 DIAGNOSIS — Z3A38 38 weeks gestation of pregnancy: Secondary | ICD-10-CM

## 2020-07-04 NOTE — Progress Notes (Signed)
    TELEHEALTH OBSTETRICS VISIT ENCOUNTER NOTE  Provider location: [facility] for [employer] at [place]   I connected with Laurie [name] on 07/04/20 at  9:35 AM EST by telephone at home and verified that I am speaking with the correct person using two identifiers. Of note, unable to do video encounter due to technical difficulties.    I discussed the limitations, risks, security and privacy concerns of performing an evaluation and management service by telephone and the availability of in person appointments. I also discussed with the patient that there may be a patient responsible charge related to this service. The patient expressed understanding and agreed to proceed.  Subjective:  Laurie [name] is a 20 y.o. G1P0 at [redacted]w[redacted]d being followed for ongoing prenatal care.  She is currently monitored for the following issues for this low-risk pregnancy and has Supervision of normal first pregnancy, antepartum and Back pain affecting pregnancy in third trimester on their problem list.  Patient reports no complaints. Reports fetal movement. Denies any contractions, bleeding or leaking of fluid.   The following portions of the patient's history were reviewed and updated as appropriate: allergies, current medications, past family history, past medical history, past social history, past surgical history and problem list.   Objective:  Last menstrual period 09/03/2019. General:  Alert, oriented and cooperative.   Mental Status: Normal mood and affect perceived. Normal judgment and thought content.  Rest of physical exam deferred due to type of encounter  Assessment and Plan:  Pregnancy: G1P0 at [redacted]w[redacted]d 1. Supervision of normal first pregnancy, antepartum --Anticipatory guidance about next visits/weeks of pregnancy given. --Next visit in 1 week in office --Labor readiness and labor precautions reviewed with pt and sent via MyChart  2. Head congestion --Pt reports having a "bad cold" at the time of  her virtual visit on 1/13.  She did not get tested for COVID but symptoms resolved.  She denies any URI or other symptoms today.  3. Elevated blood pressure reading without diagnosis of hypertension --Pt had respiratory illness and had isolated elevated BP at home on 06/23/20. BP normal on retake and pt came in to office at end of day for [facility] labs, which were wnl, P/C ratio 0.2. --BP today at home 122/80, pt denies any s/sx of PEC --PEC precautions reviewed, pt taking BP daily, none elevated, reviewed reasons to call office and/or go to hospital  Term labor symptoms and general obstetric precautions including but not limited to vaginal bleeding, contractions, leaking of fluid and fetal movement were reviewed in detail with the patient.  I discussed the assessment and treatment plan with the patient. The patient was provided an opportunity to ask questions and all were answered. The patient agreed with the plan and demonstrated an understanding of the instructions. The patient was advised to call back or seek an in-person office evaluation/go to MAU at [facility] for any urgent or concerning symptoms. Please refer to After Visit Summary for other counseling recommendations.   I provided 10 minutes of non-face-to-face time during this encounter.  Return in about 1 week (around 07/11/2020).  Future Appointments  Date Time Provider Department Center  07/11/2020 10:35 AM [place], Wilmer Floor, CNM [facility] None    Sharen Counter, CNM [facility] [employer], [facility]

## 2020-07-11 ENCOUNTER — Other Ambulatory Visit: Payer: Self-pay

## 2020-07-11 ENCOUNTER — Ambulatory Visit (INDEPENDENT_AMBULATORY_CARE_PROVIDER_SITE_OTHER): Payer: Medicaid Other | Admitting: Advanced Practice Midwife

## 2020-07-11 VITALS — BP 134/81 | HR 115 | Wt 168.6 lb

## 2020-07-11 DIAGNOSIS — O26843 Uterine size-date discrepancy, third trimester: Secondary | ICD-10-CM

## 2020-07-11 DIAGNOSIS — Z3400 Encounter for supervision of normal first pregnancy, unspecified trimester: Secondary | ICD-10-CM

## 2020-07-11 DIAGNOSIS — Z3A39 39 weeks gestation of pregnancy: Secondary | ICD-10-CM

## 2020-07-11 NOTE — Progress Notes (Signed)
   PRENATAL VISIT NOTE  Subjective:  Laurie [name] is a 20 y.o. G1P0 at [redacted]w[redacted]d being seen today for ongoing prenatal care.  She is currently monitored for the following issues for this low-risk pregnancy and has Supervision of normal first pregnancy, antepartum and Back pain affecting pregnancy in third trimester on their problem list.  Patient reports occasional contractions.  Contractions: Not present.  .  Movement: Present. Denies leaking of fluid.   The following portions of the patient's history were reviewed and updated as appropriate: allergies, current medications, past family history, past medical history, past social history, past surgical history and problem list.   Objective:   Vitals:   07/11/20 1100  BP: 134/81  Pulse: (!) 115  Weight: 168 lb 9.6 oz (76.5 kg)    Fetal Status: Fetal Heart Rate (bpm): 145 Fundal Height: 39 cm Movement: Present  Presentation: Vertex  General:  Alert, oriented and cooperative. Patient is in no acute distress.  Skin: Skin is warm and dry. No rash noted.   Cardiovascular: Normal heart rate noted  Respiratory: Normal respiratory effort, no problems with respiration noted  Abdomen: Soft, gravid, appropriate for gestational age.  Pain/Pressure: Absent     Pelvic: Cervical exam performed in the presence of a chaperone Dilation: 2 Effacement (%): 70 Station: -2  Extremities: Normal range of motion.  Edema: None  Mental Status: Normal mood and affect. Normal behavior. Normal judgment and thought content.   Assessment and Plan:  Pregnancy: G1P0 at [redacted]w[redacted]d 1. Supervision of normal first pregnancy, antepartum --Anticipatory guidance about next visits/weeks of pregnancy given. --Membranes swept at pt request, Cervix 2/70/-2, vertex --Labor readiness/labor precautions reviewed --Next appt in 1 week for NST  2. [redacted] weeks gestation of pregnancy   Term labor symptoms and general obstetric precautions including but not limited to vaginal bleeding,  contractions, leaking of fluid and fetal movement were reviewed in detail with the patient. Please refer to After Visit Summary for other counseling recommendations.   Return in about 1 week (around 07/18/2020).  Future Appointments  Date Time Provider Department Center  07/18/2020 11:00 AM [facility] None  07/19/2020  9:15 AM [facility] NURSE [facility]  07/19/2020  9:30 AM [facility] US3 [facility]    Sharen Counter, CNM

## 2020-07-11 NOTE — Patient Instructions (Signed)
Things to Try After 37 weeks to Encourage Labor/Get Ready for Labor:   1.  Try the Miles Circuit at www.milescircuit.com daily to improve baby's position and encourage the onset of labor.  2. Walk a little and rest a little every day.  Change positions often.  3. Cervical Ripening: May try one or both a. Red Raspberry Leaf capsules or tea:  two 300mg or 400mg tablets with each meal, 2-3 times a day, or 1-3 cups of tea daily  Potential Side Effects Of Raspberry Leaf:  Most women do not experience any side effects from drinking raspberry leaf tea. However, nausea and loose stools are possible   b. Evening Primrose Oil capsules: take 1 capsule by mouth and place one capsule in the vagina every night.    Some of the potential side effects:  Upset stomach  Loose stools or diarrhea  Headaches  Nausea  4. Sex can also help the cervix ripen and encourage labor onset.    Labor Precautions Reasons to come to MAU at [facility]:  1.  Contractions are  5 minutes apart or less, each last 1 minute, these have been going on for 1-2 hours, and you cannot walk or talk during them 2.  You have a large gush of fluid, or a trickle of fluid that will not stop and you have to wear a pad 3.  You have bleeding that is bright red, heavier than spotting--like menstrual bleeding (spotting can be normal in early labor or after a check of your cervix) 4.  You do not feel the baby moving like he/she normally does 

## 2020-07-12 ENCOUNTER — Other Ambulatory Visit: Payer: Self-pay

## 2020-07-12 ENCOUNTER — Inpatient Hospital Stay (HOSPITAL_COMMUNITY)
Admission: AD | Admit: 2020-07-12 | Discharge: 2020-07-14 | DRG: 805 | Disposition: A | Discharge status: Home / Self Care | Payer: Medicaid Other | Attending: Family Medicine | Admitting: Family Medicine

## 2020-07-12 ENCOUNTER — Encounter (HOSPITAL_COMMUNITY): Payer: Self-pay | Admitting: Family Medicine

## 2020-07-12 DIAGNOSIS — O9852 Other viral diseases complicating childbirth: Secondary | ICD-10-CM | POA: Diagnosis present

## 2020-07-12 DIAGNOSIS — O99892 Other specified diseases and conditions complicating childbirth: Secondary | ICD-10-CM | POA: Diagnosis present

## 2020-07-12 DIAGNOSIS — O1490 Unspecified pre-eclampsia, unspecified trimester: Secondary | ICD-10-CM

## 2020-07-12 DIAGNOSIS — Z3490 Encounter for supervision of normal pregnancy, unspecified, unspecified trimester: Secondary | ICD-10-CM

## 2020-07-12 DIAGNOSIS — E876 Hypokalemia: Secondary | ICD-10-CM | POA: Diagnosis present

## 2020-07-12 DIAGNOSIS — Z3A39 39 weeks gestation of pregnancy: Secondary | ICD-10-CM

## 2020-07-12 DIAGNOSIS — U071 COVID-19: Secondary | ICD-10-CM | POA: Diagnosis present

## 2020-07-12 DIAGNOSIS — O1494 Unspecified pre-eclampsia, complicating childbirth: Principal | ICD-10-CM | POA: Diagnosis present

## 2020-07-12 HISTORY — DX: Other specified health status: Z78.9

## 2020-07-12 NOTE — MAU Note (Signed)
Plan of care discussed with pt and SO-pt continues to deny HA, visual changes or epigastric pain. Discussed consistent Bps 130-140/80-90 and concerns for pre eclampsia. Voiced understanding and is agreeable with Dr.Firestones plan for admission.

## 2020-07-12 NOTE — MAU Provider Note (Addendum)
Aylyn [name] is a 20 y.o. female G1P0 presenting for contractions @[redacted]w[redacted]d   and elevated blood pressures.   OB History    Gravida  1   Para      Term      Preterm      AB      Living        SAB      IAB      Ectopic      Multiple      Live Births             Past Medical History:  Diagnosis Date  . Medical history non-contributory    Past Surgical History:  Procedure Laterality Date  . NO PAST SURGERIES     Family History: family history is not on file. Social History:  reports that she has never smoked. She has never used smokeless tobacco. She reports that she does not drink alcohol and does not use drugs.     Maternal Diabetes: No Genetic Screening: Normal Maternal Ultrasounds/Referrals: Normal Fetal Ultrasounds or other Referrals:  None Maternal Substance Abuse:  No Significant Maternal Medications:  None Significant Maternal Lab Results:  None Other Comments:  New onset hypertension  Review of Systems  Constitutional: Negative for chills and fever.  Eyes: Negative for visual disturbance.  Respiratory: Negative for shortness of breath.   Cardiovascular: Negative for chest pain and leg swelling.  Gastrointestinal: Positive for abdominal pain. Negative for nausea and vomiting.  Genitourinary: Positive for pelvic pain. Negative for vaginal bleeding.  Neurological: Negative for headaches.   Maternal Medical History:  Reason for admission: Contractions.  Nausea.  Contractions: Onset was yesterday.   Frequency: regular.   Perceived severity is moderate.    Fetal activity: Perceived fetal activity is normal.   Last perceived fetal movement was within the past hour.    Prenatal complications: PIH.   Prenatal Complications - Diabetes: none.    Dilation: 3 Effacement (%): 90 Station: -1 Exam by:: [email or web address] RN Blood pressure (!) 132/91, pulse 93, temperature 98.1 F (36.7 C), temperature source Oral, resp. rate 17, height 5\' 2"  (1.575 m),  weight 76.7 kg, last menstrual period 09/03/2019, SpO2 98 %. Maternal Exam:  Uterine Assessment: Contraction strength is moderate.  Contraction frequency is regular.   Abdomen: Patient reports no abdominal tenderness. Introitus: Normal vulva. Normal vagina.  Ferning test: not done.  Nitrazine test: not done.  Pelvis: adequate for delivery.   Cervix: Cervix evaluated by digital exam.     Fetal Exam Fetal Monitor Review: Mode: ultrasound.   Variability: moderate (6-25 bpm).   Pattern: accelerations present and no decelerations.    Fetal State Assessment: Category I - tracings are normal.     Physical Exam Constitutional:      Appearance: Normal appearance.  HENT:     Head: Normocephalic.  Cardiovascular:     Rate and Rhythm: Normal rate and regular rhythm.     Heart sounds: Normal heart sounds.  Pulmonary:     Breath sounds: Normal breath sounds.  Abdominal:     General: Bowel sounds are normal.     Palpations: Abdomen is soft.  Genitourinary:    General: Normal vulva.  Musculoskeletal:        General: Normal range of motion.     Cervical back: Normal range of motion.  Skin:    General: Skin is warm and dry.  Neurological:     General: No focal deficit present.     Mental Status: She  is alert and oriented to person, place, and time.     Prenatal labs: ABO, Rh: B/Positive/-- (06/22 1159) Antibody: Negative (06/22 1159) Rubella: 4.46 (06/22 1159) RPR: Non Reactive (12/01 1003)  HBsAg: Negative (06/22 1159)  HIV: Non Reactive (12/01 1003)  GBS: Negative/-- (01/13 0457)   Assessment:  SIUP  @[redacted]w[redacted]d   Labor Management  New Onset Hypertension, see labs  Plan:  Admit for labor and delivery  Routine orders  Anticipatory guidance regarding course of labor, augmentation if needed     Juliann Pares 07/12/2020, 11:20 PM  Attestation:  I confirm that I have verified the information documented in the student nurse midwife's note  and that I have also personally reperformed the physical exam and all medical decision making activities.  The patient was seen and examined by me also Agree with note NST reactive and reassuring UCs as listed Cervical exams as listed in note   09/09/2020, CNM

## 2020-07-12 NOTE — MAU Note (Signed)
Was 2cm in office yesterday and had membrane sweep. Ctxs since last night that were irreg. Today ctxs have gradually gotten closer and stronger. Denies VB or LOF

## 2020-07-13 ENCOUNTER — Encounter (HOSPITAL_COMMUNITY): Payer: Self-pay | Admitting: Family Medicine

## 2020-07-13 ENCOUNTER — Inpatient Hospital Stay (HOSPITAL_COMMUNITY): Payer: Medicaid Other | Admitting: Anesthesiology

## 2020-07-13 DIAGNOSIS — E876 Hypokalemia: Secondary | ICD-10-CM | POA: Diagnosis not present

## 2020-07-13 DIAGNOSIS — Z3A39 39 weeks gestation of pregnancy: Secondary | ICD-10-CM | POA: Diagnosis not present

## 2020-07-13 DIAGNOSIS — O1494 Unspecified pre-eclampsia, complicating childbirth: Secondary | ICD-10-CM | POA: Diagnosis not present

## 2020-07-13 DIAGNOSIS — O1490 Unspecified pre-eclampsia, unspecified trimester: Secondary | ICD-10-CM

## 2020-07-13 DIAGNOSIS — O134 Gestational [pregnancy-induced] hypertension without significant proteinuria, complicating childbirth: Secondary | ICD-10-CM | POA: Diagnosis not present

## 2020-07-13 DIAGNOSIS — O99892 Other specified diseases and conditions complicating childbirth: Secondary | ICD-10-CM | POA: Diagnosis not present

## 2020-07-13 DIAGNOSIS — O9852 Other viral diseases complicating childbirth: Secondary | ICD-10-CM | POA: Diagnosis not present

## 2020-07-13 DIAGNOSIS — Z3490 Encounter for supervision of normal pregnancy, unspecified, unspecified trimester: Secondary | ICD-10-CM

## 2020-07-13 DIAGNOSIS — R030 Elevated blood-pressure reading, without diagnosis of hypertension: Secondary | ICD-10-CM | POA: Diagnosis not present

## 2020-07-13 DIAGNOSIS — U071 COVID-19: Secondary | ICD-10-CM | POA: Diagnosis not present

## 2020-07-13 LAB — COMPREHENSIVE METABOLIC PANEL
ALT: 10 U/L (ref 0–44)
AST: 17 U/L (ref 15–41)
Albumin: 2.7 g/dL — ABNORMAL LOW (ref 3.5–5.0)
Alkaline Phosphatase: 101 U/L (ref 38–126)
Anion gap: 11 (ref 5–15)
BUN: <5 mg/dL — ABNORMAL LOW (ref 6–20)
CO2: 21 mmol/L — ABNORMAL LOW (ref 22–32)
Calcium: 8.6 mg/dL — ABNORMAL LOW (ref 8.9–10.3)
Chloride: 106 mmol/L (ref 98–111)
Creatinine, Ser: 0.48 mg/dL (ref 0.44–1.00)
GFR, Estimated: >60 mL/min (ref >60)
Glucose, Bld: 83 mg/dL (ref 70–99)
Potassium: 2.7 mmol/L — CL (ref 3.5–5.1)
Sodium: 138 mmol/L (ref 135–145)
Total Bilirubin: 0.5 mg/dL (ref 0.3–1.2)
Total Protein: 6.1 g/dL — ABNORMAL LOW (ref 6.5–8.1)

## 2020-07-13 LAB — URINALYSIS, ROUTINE W REFLEX MICROSCOPIC
Bilirubin Urine: NEGATIVE
Glucose, UA: NEGATIVE mg/dL
Ketones, ur: NEGATIVE mg/dL
Nitrite: NEGATIVE
Protein, ur: NEGATIVE mg/dL
Specific Gravity, Urine: 1.01 (ref 1.005–1.030)
pH: 7 (ref 5.0–8.0)

## 2020-07-13 LAB — SARS CORONAVIRUS 2 (TAT 6-24 HRS): SARS Coronavirus 2: POSITIVE — AB

## 2020-07-13 LAB — CBC
HCT: 30.7 % — ABNORMAL LOW (ref 36.0–46.0)
Hemoglobin: 9.6 g/dL — ABNORMAL LOW (ref 12.0–15.0)
MCH: 27.1 pg (ref 26.0–34.0)
MCHC: 31.3 g/dL (ref 30.0–36.0)
MCV: 86.7 fL (ref 80.0–100.0)
Platelets: 195 10*3/uL (ref 150–400)
RBC: 3.54 MIL/uL — ABNORMAL LOW (ref 3.87–5.11)
RDW: 15.9 % — ABNORMAL HIGH (ref 11.5–15.5)
WBC: 10.5 10*3/uL (ref 4.0–10.5)
nRBC: 0 % (ref 0.0–0.2)

## 2020-07-13 LAB — PROTEIN / CREATININE RATIO, URINE
Creatinine, Urine: 66.17 mg/dL
Protein Creatinine Ratio: 0.45 mg/mg{Cre} — ABNORMAL HIGH (ref 0.00–0.15)
Total Protein, Urine: 30 mg/dL

## 2020-07-13 LAB — URINALYSIS, MICROSCOPIC (REFLEX)

## 2020-07-13 LAB — TYPE AND SCREEN
ABO/RH(D): B POS
Antibody Screen: NEGATIVE

## 2020-07-13 LAB — SARS CORONAVIRUS 2 BY RT PCR (HOSPITAL ORDER, PERFORMED IN ~~LOC~~ HOSPITAL LAB)
SARS Coronavirus 2: NEGATIVE
SARS Coronavirus 2: POSITIVE — AB

## 2020-07-13 LAB — MAGNESIUM: Magnesium: 1.5 mg/dL — ABNORMAL LOW (ref 1.7–2.4)

## 2020-07-13 MED ORDER — SENNOSIDES-DOCUSATE SODIUM 8.6-50 MG PO TABS
2.0000 | ORAL_TABLET | Freq: Every day | ORAL | Status: DC
  Administered 2020-07-14: 2 via ORAL
  Filled 2020-07-13: qty 2

## 2020-07-13 MED ORDER — OXYCODONE-ACETAMINOPHEN 5-325 MG PO TABS: 1.0000 | ORAL_TABLET | ORAL | Status: DC | PRN

## 2020-07-13 MED ORDER — POTASSIUM CHLORIDE CRYS ER 20 MEQ PO TBCR
20.0000 meq | EXTENDED_RELEASE_TABLET | ORAL | Status: DC
  Administered 2020-07-13 (×3): 20 meq via ORAL
  Filled 2020-07-13 (×4): qty 1

## 2020-07-13 MED ORDER — PRENATAL MULTIVITAMIN CH
1.0000 | ORAL_TABLET | Freq: Every day | ORAL | Status: DC
  Administered 2020-07-14: 1 via ORAL
  Filled 2020-07-13: qty 1

## 2020-07-13 MED ORDER — FENTANYL CITRATE (PF) 100 MCG/2ML IJ SOLN
100.0000 ug | INTRAMUSCULAR | Status: DC | PRN
  Administered 2020-07-13 (×3): 100 ug via INTRAVENOUS
  Filled 2020-07-13 (×3): qty 2

## 2020-07-13 MED ORDER — SOD CITRATE-CITRIC ACID 500-334 MG/5ML PO SOLN: 30.0000 mL | ORAL | Status: DC | PRN

## 2020-07-13 MED ORDER — PHENYLEPHRINE 40 MCG/ML (10ML) SYRINGE FOR IV PUSH (FOR BLOOD PRESSURE SUPPORT): 80.0000 ug | PREFILLED_SYRINGE | INTRAVENOUS | Status: DC | PRN

## 2020-07-13 MED ORDER — WITCH HAZEL-GLYCERIN EX PADS: 1.0000 "application " | MEDICATED_PAD | CUTANEOUS | Status: DC | PRN

## 2020-07-13 MED ORDER — LIDOCAINE-EPINEPHRINE (PF) 2 %-1:200000 IJ SOLN
INTRAMUSCULAR | Status: DC | PRN
  Administered 2020-07-13: 5 mL via EPIDURAL

## 2020-07-13 MED ORDER — ONDANSETRON HCL 4 MG PO TABS: 4.0000 mg | ORAL_TABLET | ORAL | Status: DC | PRN

## 2020-07-13 MED ORDER — ONDANSETRON HCL 4 MG/2ML IJ SOLN
4.0000 mg | Freq: Four times a day (QID) | INTRAMUSCULAR | Status: DC | PRN
  Administered 2020-07-13: 4 mg via INTRAVENOUS
  Filled 2020-07-13: qty 2

## 2020-07-13 MED ORDER — LIDOCAINE HCL (PF) 1 % IJ SOLN
30.0000 mL | INTRAMUSCULAR | Status: AC | PRN
  Administered 2020-07-13: 30 mL via SUBCUTANEOUS
  Filled 2020-07-13: qty 30

## 2020-07-13 MED ORDER — ACETAMINOPHEN 325 MG PO TABS: 650.0000 mg | ORAL_TABLET | ORAL | Status: DC | PRN

## 2020-07-13 MED ORDER — OXYTOCIN BOLUS FROM INFUSION
333.0000 mL | Freq: Once | INTRAVENOUS | Status: AC
  Administered 2020-07-13: 333 mL via INTRAVENOUS

## 2020-07-13 MED ORDER — DIPHENHYDRAMINE HCL 25 MG PO CAPS: 25.0000 mg | ORAL_CAPSULE | Freq: Four times a day (QID) | ORAL | Status: DC | PRN

## 2020-07-13 MED ORDER — FENTANYL-BUPIVACAINE-NACL 0.5-0.125-0.9 MG/250ML-% EP SOLN
12.0000 mL/h | EPIDURAL | Status: DC | PRN
  Administered 2020-07-13: 12 mL/h via EPIDURAL
  Filled 2020-07-13: qty 250

## 2020-07-13 MED ORDER — ONDANSETRON HCL 4 MG/2ML IJ SOLN: 4.0000 mg | Freq: Four times a day (QID) | INTRAMUSCULAR | Status: DC

## 2020-07-13 MED ORDER — SIMETHICONE 80 MG PO CHEW
80.0000 mg | CHEWABLE_TABLET | ORAL | Status: DC | PRN
Start: 2020-07-13 — End: 2020-07-15

## 2020-07-13 MED ORDER — OXYCODONE-ACETAMINOPHEN 5-325 MG PO TABS: 2.0000 | ORAL_TABLET | ORAL | Status: DC | PRN

## 2020-07-13 MED ORDER — OXYTOCIN-SODIUM CHLORIDE 30-0.9 UT/500ML-% IV SOLN
1.0000 m[IU]/min | INTRAVENOUS | Status: DC
  Administered 2020-07-13: 2 m[IU]/min via INTRAVENOUS
  Filled 2020-07-13: qty 500

## 2020-07-13 MED ORDER — TERBUTALINE SULFATE 1 MG/ML IJ SOLN: 0.2500 mg | Freq: Once | INTRAMUSCULAR | Status: DC | PRN

## 2020-07-13 MED ORDER — ONDANSETRON HCL 4 MG/2ML IJ SOLN: 4.0000 mg | INTRAMUSCULAR | Status: DC | PRN

## 2020-07-13 MED ORDER — OXYTOCIN-SODIUM CHLORIDE 30-0.9 UT/500ML-% IV SOLN: 2.5000 [IU]/h | INTRAVENOUS | Status: DC

## 2020-07-13 MED ORDER — DIBUCAINE (PERIANAL) 1 % EX OINT: 1.0000 "application " | TOPICAL_OINTMENT | CUTANEOUS | Status: DC | PRN

## 2020-07-13 MED ORDER — DIPHENHYDRAMINE HCL 50 MG/ML IJ SOLN: 12.5000 mg | INTRAMUSCULAR | Status: DC | PRN

## 2020-07-13 MED ORDER — LACTATED RINGERS IV SOLN: INTRAVENOUS | Status: DC

## 2020-07-13 MED ORDER — LACTATED RINGERS IV SOLN: 500.0000 mL | Freq: Once | INTRAVENOUS | Status: DC

## 2020-07-13 MED ORDER — POTASSIUM CHLORIDE CRYS ER 20 MEQ PO TBCR
20.0000 meq | EXTENDED_RELEASE_TABLET | Freq: Once | ORAL | Status: AC
  Administered 2020-07-14: 20 meq via ORAL
  Filled 2020-07-13: qty 1

## 2020-07-13 MED ORDER — IBUPROFEN 600 MG PO TABS
600.0000 mg | ORAL_TABLET | Freq: Four times a day (QID) | ORAL | Status: DC
  Administered 2020-07-13 – 2020-07-14 (×4): 600 mg via ORAL
  Filled 2020-07-13 (×3): qty 1

## 2020-07-13 MED ORDER — BENZOCAINE-MENTHOL 20-0.5 % EX AERO
1.0000 "application " | INHALATION_SPRAY | CUTANEOUS | Status: DC | PRN
  Administered 2020-07-13: 1 via TOPICAL
  Filled 2020-07-13: qty 56

## 2020-07-13 MED ORDER — EPHEDRINE 5 MG/ML INJ: 10.0000 mg | INTRAVENOUS | Status: DC | PRN

## 2020-07-13 MED ORDER — POTASSIUM CHLORIDE 2 MEQ/ML IV SOLN
Freq: Once | INTRAVENOUS | Status: DC
  Filled 2020-07-13: qty 1000

## 2020-07-13 MED ORDER — COCONUT OIL OIL: 1.0000 "application " | TOPICAL_OIL | Status: DC | PRN

## 2020-07-13 MED ORDER — TETANUS-DIPHTH-ACELL PERTUSSIS 5-2.5-18.5 LF-MCG/0.5 IM SUSY: 0.5000 mL | PREFILLED_SYRINGE | Freq: Once | INTRAMUSCULAR | Status: DC

## 2020-07-13 MED ORDER — LACTATED RINGERS IV SOLN: 500.0000 mL | INTRAVENOUS | Status: DC | PRN

## 2020-07-13 NOTE — Progress Notes (Signed)
Patient ID: Laurie [name], female   DOB: [birth year], 20 y.o.   MRN: [id number] Doing well Fentanyl helped contraction pain but made her a little nauseated  . Vitals:   07/12/20 2340 07/13/20 0001 07/13/20 0101 07/13/20 0201  BP:  (!) 141/96 (!) 140/92 134/81  Pulse:  97 97 94  Resp:  19 18 17   Temp:  98.7 F (37.1 C)    TempSrc:  Oral    SpO2: 98%     Weight:      Height:       FHR reasuring, Category I UCs every 4-5 min   Dilation: 3 Effacement (%): 80 Cervical Position: Posterior Station: -2 Presentation: Vertex Exam by:: [email or web address], CNM  Will start Pitocin augmentation

## 2020-07-13 NOTE — Anesthesia Procedure Notes (Signed)
Epidural Patient location during procedure: OB Start time: 07/13/2020 6:35 AM End time: 07/13/2020 6:45 AM  Staffing Anesthesiologist: Elmer Picker, MD Performed: anesthesiologist   Preanesthetic Checklist Completed: patient identified, IV checked, risks and benefits discussed, monitors and equipment checked, pre-op evaluation and timeout performed  Epidural Patient position: sitting Prep: DuraPrep and site prepped and draped Patient monitoring: continuous pulse ox, blood pressure, heart rate and cardiac monitor Approach: midline Location: L3-L4 Injection technique: LOR air  Needle:  Needle type: Tuohy  Needle gauge: 17 G Needle length: 9 cm Needle insertion depth: 5 cm Catheter type: closed end flexible Catheter size: 19 Gauge Catheter at skin depth: 10 cm Test dose: negative  Assessment Sensory level: T8 Events: blood not aspirated, injection not painful, no injection resistance, no paresthesia and negative IV test  Additional Notes Patient identified. Risks/Benefits/Options discussed with patient including but not limited to bleeding, infection, nerve damage, paralysis, failed block, incomplete pain control, headache, blood pressure changes, nausea, vomiting, reactions to medication both or allergic, itching and postpartum back pain. Confirmed with bedside nurse the patient's most recent platelet count. Confirmed with patient that they are not currently taking any anticoagulation, have any bleeding history or any family history of bleeding disorders. Patient expressed understanding and wished to proceed. All questions were answered. Sterile technique was used throughout the entire procedure. Please see nursing notes for vital signs. Test dose was given through epidural catheter and negative prior to continuing to dose epidural or start infusion. Warning signs of high block given to the patient including shortness of breath, tingling/numbness in hands, complete motor block, or  any concerning symptoms with instructions to call for help. Patient was given instructions on fall risk and not to get out of bed. All questions and concerns addressed with instructions to call with any issues or inadequate analgesia.  Reason for block:procedure for pain

## 2020-07-13 NOTE — Anesthesia Postprocedure Evaluation (Signed)
Anesthesia Post Note  Patient: Laurie [name]  Procedure(s) Performed: AN AD HOC LABOR EPIDURAL     Patient location during evaluation: Mother Baby Anesthesia Type: Epidural Level of consciousness: awake Pain management: satisfactory to patient Vital Signs Assessment: post-procedure vital signs reviewed and stable Respiratory status: spontaneous breathing Cardiovascular status: stable Anesthetic complications: no   No complications documented.  Last Vitals:  Vitals:   07/13/20 1610 07/13/20 1659  BP: 125/77 121/83  Pulse: 84 98  Resp:    Temp:  36.9 C  SpO2:      Last Pain:  Vitals:   07/13/20 1659  TempSrc: Oral  PainSc:    Pain Goal: Patients Stated Pain Goal: 8 (07/13/20 0001)                 Cephus Shelling

## 2020-07-13 NOTE — Discharge Summary (Signed)
Postpartum Discharge Summary     Patient Name: Laurie [name] DOB: 2001-04-08 MRN: [id number]  Date of admission: 07/12/2020 Delivery date:07/13/2020  Delivering provider: Shary Key  Date of discharge: 07/14/2020  Admitting diagnosis: Pregnant [Z34.90] Intrauterine pregnancy: [redacted]w[redacted]d    Secondary diagnosis:  Active Problems:   Pregnant   Preeclampsia  Additional problems: COVID 19 positive on admission    Discharge diagnosis: Term Pregnancy Delivered                                              Post partum procedures:None Augmentation: AROM and Pitocin Complications: None  Hospital course: Onset of Labor With Vaginal Delivery      20y.o. yo G1P1001 at 33w3das admitted in Latent Labor on 07/12/2020. Patient had an uncomplicated labor course as follows:  Membrane Rupture Time/Date: 12:01 PM ,07/13/2020   Delivery Method:Vaginal, Spontaneous  Episiotomy: None  Lacerations:  2nd degree;Periurethral  Patient had an uncomplicated postpartum course.  She is ambulating, tolerating a regular diet, passing flatus, and urinating well. Patient is discharged home in stable condition on 07/14/20.  Newborn Data: Birth date:07/13/2020  Birth time:1:49 PM  Gender:Female  Living status:Living  Apgars:8 ,9  Weight:3155 g   Magnesium Sulfate received: No BMZ received: No Rhophylac:No MMR:No T-DaP:Given prenatally Flu: No Transfusion:No  Physical exam  Vitals:   07/13/20 2005 07/14/20 0013 07/14/20 0432 07/14/20 1428  BP: 140/77 126/76 129/90 122/80  Pulse: 95 76 72 75  Resp: 18 16 18 18   Temp: 98 F (36.7 C) 98.2 F (36.8 C) 98.4 F (36.9 C) 97.9 F (36.6 C)  TempSrc: Oral Oral Oral Oral  SpO2: 98% 100% 99% 100%  Weight:      Height:       General: alert, cooperative and no distress Lochia: appropriate Uterine Fundus: firm Incision: N/A DVT Evaluation: No evidence of DVT seen on physical exam. Labs: Lab Results  Component Value Date   WBC 11.2 (H) 07/14/2020   HGB  8.1 (L) 07/14/2020   HCT 25.6 (L) 07/14/2020   MCV 85.9 07/14/2020   PLT 162 07/14/2020   CMP Latest Ref Rng & Units 07/14/2020  Glucose 70 - 99 mg/dL 73  BUN 6 - 20 mg/dL <5(L)  Creatinine 0.44 - 1.00 mg/dL 0.49  Sodium 135 - 145 mmol/L 136  Potassium 3.5 - 5.1 mmol/L 3.0(L)  Chloride 98 - 111 mmol/L 105  CO2 22 - 32 mmol/L 22  Calcium 8.9 - 10.3 mg/dL 8.6(L)  Total Protein 6.5 - 8.1 g/dL 4.9(L)  Total Bilirubin 0.3 - 1.2 mg/dL 0.6  Alkaline Phos 38 - 126 U/L 90  AST 15 - 41 U/L 20  ALT 0 - 44 U/L 9   Edinburgh Score: Edinburgh Postnatal Depression Scale Screening Tool 07/13/2020  I have been able to laugh and see the funny side of things. 0  I have looked forward with enjoyment to things. 0  I have blamed myself unnecessarily when things went wrong. 0  I have been anxious or worried for no good reason. 0  I have felt scared or panicky for no good reason. 0  Things have been getting on top of me. 0  I have been so unhappy that I have had difficulty sleeping. 0  I have felt sad or miserable. 0  I have been so unhappy that I have been  crying. 0  The thought of harming myself has occurred to me. 0  Edinburgh Postnatal Depression Scale Total 0     After visit meds:  Allergies as of 07/14/2020   No Known Allergies     Medication List    STOP taking these medications   Blood Pressure Kit Devi   calcium carbonate 500 MG chewable tablet Commonly known as: TUMS - dosed in mg elemental calcium   Comfort Fit Maternity Supp Med Misc   prenatal multivitamin Tabs tablet     TAKE these medications   amLODipine 5 MG tablet Commonly known as: NORVASC Take 1 tablet (5 mg total) by mouth daily. Start taking on: July 15, 2020   ferrous sulfate 325 (65 FE) MG tablet Take 325 mg by mouth every other day.   ibuprofen 800 MG tablet Commonly known as: ADVIL Take 1 tablet (800 mg total) by mouth every 8 (eight) hours as needed.        Discharge home in stable  condition Infant Feeding: Bottle Infant Disposition:home with mother Discharge instruction: per After Visit Summary and Postpartum booklet. Activity: Advance as tolerated. Pelvic rest for 6 weeks.  Diet: routine diet Future Appointments: Future Appointments  Date Time Provider Wilder  08/10/2020 10:15 AM Nugent, Gerrie Nordmann, NP [facility] None   Follow up Visit:  Please schedule this patient for a In person postpartum visit in 4 weeks with the following provider: Any provider. Additional Postpartum F/U:pap smear  High risk pregnancy complicated by: pre-eclampsia Delivery mode:  Vaginal, Spontaneous  Anticipated Birth Control:  Unsure   07/14/2020 Shary Key, DO

## 2020-07-13 NOTE — Anesthesia Preprocedure Evaluation (Signed)
Anesthesia Evaluation  Patient identified by MRN, date of birth, ID band Patient awake    Reviewed: Allergy & Precautions, NPO status , Patient's Chart, lab work & pertinent test results  Airway Mallampati: II  TM Distance: >3 FB Neck ROM: Full    Dental no notable dental hx.    Pulmonary neg pulmonary ROS,    Pulmonary exam normal breath sounds clear to auscultation       Cardiovascular hypertension (gHTN), Normal cardiovascular exam Rhythm:Regular Rate:Normal     Neuro/Psych negative neurological ROS  negative psych ROS   GI/Hepatic negative GI ROS, Neg liver ROS,   Endo/Other  negative endocrine ROS  Renal/GU negative Renal ROS  negative genitourinary   Musculoskeletal negative musculoskeletal ROS (+)   Abdominal   Peds  Hematology negative hematology ROS (+)   Anesthesia Other Findings COVID positive, no symptoms  Reproductive/Obstetrics (+) Pregnancy                             Anesthesia Physical Anesthesia Plan  ASA: III  Anesthesia Plan: Epidural   Post-op Pain Management:    Induction:   PONV Risk Score and Plan: Treatment may vary due to age or medical condition  Airway Management Planned: Natural Airway  Additional Equipment:   Intra-op Plan:   Post-operative Plan:   Informed Consent: I have reviewed the patients History and Physical, chart, labs and discussed the procedure including the risks, benefits and alternatives for the proposed anesthesia with the patient or authorized representative who has indicated his/her understanding and acceptance.       Plan Discussed with: Anesthesiologist  Anesthesia Plan Comments: (Patient identified. Risks, benefits, options discussed with patient including but not limited to bleeding, infection, nerve damage, paralysis, failed block, incomplete pain control, headache, blood pressure changes, nausea, vomiting, reactions to  medication, itching, and post partum back pain. Confirmed with bedside nurse the patient's most recent platelet count. Confirmed with the patient that they are not taking any anticoagulation, have any bleeding history or any family history of bleeding disorders. Patient expressed understanding and wishes to proceed. All questions were answered. )        Anesthesia Quick Evaluation

## 2020-07-13 NOTE — H&P (Addendum)
Laurie [name] is a 20 y.o. female G1P0 presenting for contractions @[redacted]w[redacted]d   and elevated blood pressures.   OB History    Gravida  1   Para      Term      Preterm      AB      Living        SAB      IAB      Ectopic      Multiple      Live Births             Past Medical History:  Diagnosis Date  . Medical history non-contributory    Past Surgical History:  Procedure Laterality Date  . NO PAST SURGERIES     Family History: family history is not on file. Social History:  reports that she has never smoked. She has never used smokeless tobacco. She reports that she does not drink alcohol and does not use drugs.     Maternal Diabetes: No Genetic Screening: Normal Maternal Ultrasounds/Referrals: Normal Fetal Ultrasounds or other Referrals:  None Maternal Substance Abuse:  No Significant Maternal Medications:  None Significant Maternal Lab Results:  None Other Comments:  New onset hypertension  Review of Systems  Constitutional: Negative for chills and fever.  Eyes: Negative for visual disturbance.  Respiratory: Negative for shortness of breath.   Cardiovascular: Negative for chest pain and leg swelling.  Gastrointestinal: Positive for abdominal pain. Negative for nausea and vomiting.  Genitourinary: Positive for pelvic pain. Negative for vaginal bleeding.  Neurological: Negative for headaches.   Maternal Medical History:  Reason for admission: Contractions.  Nausea.  Contractions: Onset was yesterday.   Frequency: regular.   Perceived severity is moderate.    Fetal activity: Perceived fetal activity is normal.   Last perceived fetal movement was within the past hour.    Prenatal complications: PIH.   Prenatal Complications - Diabetes: none.    Dilation: 3 Effacement (%): 90 Station: -1 Exam by:: [email or web address] RN Blood pressure (!) 132/91, pulse 93, temperature 98.1 F (36.7 C), temperature source Oral, resp. rate 17, height 5\' 2"  (1.575 m),  weight 76.7 kg, last menstrual period 09/03/2019, SpO2 98 %. Maternal Exam:  Uterine Assessment: Contraction strength is moderate.  Contraction frequency is regular.   Abdomen: Patient reports no abdominal tenderness. Introitus: Normal vulva. Normal vagina.  Ferning test: not done.  Nitrazine test: not done.  Pelvis: adequate for delivery.   Cervix: Cervix evaluated by digital exam.     Fetal Exam Fetal Monitor Review: Mode: ultrasound.   Variability: moderate (6-25 bpm).   Pattern: accelerations present and no decelerations.    Fetal State Assessment: Category I - tracings are normal.     Physical Exam Constitutional:      Appearance: Normal appearance.  HENT:     Head: Normocephalic.  Cardiovascular:     Rate and Rhythm: Normal rate and regular rhythm.     Heart sounds: Normal heart sounds.  Pulmonary:     Breath sounds: Normal breath sounds.  Abdominal:     General: Bowel sounds are normal.     Palpations: Abdomen is soft.  Genitourinary:    General: Normal vulva.  Musculoskeletal:        General: Normal range of motion.     Cervical back: Normal range of motion.  Skin:    General: Skin is warm and dry.  Neurological:     General: No focal deficit present.     Mental Status: She  is alert and oriented to person, place, and time.     Prenatal labs: ABO, Rh: B/Positive/-- (06/22 1159) Antibody: Negative (06/22 1159) Rubella: 4.46 (06/22 1159) RPR: Non Reactive (12/01 1003)  HBsAg: Negative (06/22 1159)  HIV: Non Reactive (12/01 1003)  GBS: Negative/-- (01/13 0457)   Assessment:  [facility]  @[redacted]w[redacted]d   Labor Management  New Onset Hypertension, see labs  Plan:  Admit for labor and delivery  Routine orders  Anticipatory guidance regarding course of labor, augmentation if needed     Juliann Pares 07/12/2020, 11:20 PM  Attestation:  I confirm that I have verified the information documented in the SNM's note and that I have  also personally reperformed the physical exam and all medical decision making activities.   The patient was seen and examined by me also Agree with note NST reactive and reassuring UCs as listed Cervical exams as listed in note  Hypokalemia noted Will give K-Dur 09/09/2020 PO x 4doses WIll also check Magnesium level  , CNM

## 2020-07-13 NOTE — Progress Notes (Signed)
Laurie [name] is a 20 y.o. G1P0 at [redacted]w[redacted]d by LMP admitted for active labor  Subjective: Relaxing well in bed. Father of the baby at bedside for comfort. Epidural in place and pain is controlled   Denies difficulty breathing, respiratory distress, chest pain, and leg swelling or pain.  Objective: BP 127/66   Pulse 87   Temp 98.4 F (36.9 C)   Resp 16   Ht 5\' 2"  (1.575 m)   Wt 76.7 kg   LMP 09/03/2019   SpO2 98%   BMI 30.91 kg/m  No intake/output data recorded. No intake/output data recorded.  FHT:  FHR: 130 bpm, variability: moderate,  accelerations:  Present,  decelerations:  Absent UC:   regular, every 1.5-3 minutes SVE:   Dilation: 4 Effacement (%): 80 Station: -2 Exam by:: k fields, rn  Labs: Lab Results  Component Value Date   WBC 10.5 07/12/2020   HGB 9.6 (L) 07/12/2020   HCT 30.7 (L) 07/12/2020   MCV 86.7 07/12/2020   PLT 195 07/12/2020    Assessment :  Augmentation of labor, progressing well   Epidural in place  New onset of hypertension  Plan:  Labor: Progressing normally and Progressing on Pitocin, will continue to increase then AROM Preeclampsia:  no signs or symptoms of toxicity Fetal Wellbeing:  Category I Pain Control:  Epidural Anticipated MOD:  NSVD   Anticipatory guidance regarding course of labor management.  09/09/2020  Juliann Pares 07/13/2020, 8:33 AM

## 2020-07-13 NOTE — Lactation Note (Signed)
Lactation Consultation Note  Patient Name: Laurie [name] RSWNI'O Date: 07/13/2020   Age:20 y.o.  Per L&D RN, patient declines LC support at this time.    Elder Negus, MA IBCLC 07/13/2020, 2:07 PM

## 2020-07-14 LAB — CBC
HCT: 25.6 % — ABNORMAL LOW (ref 36.0–46.0)
Hemoglobin: 8.1 g/dL — ABNORMAL LOW (ref 12.0–15.0)
MCH: 27.2 pg (ref 26.0–34.0)
MCHC: 31.6 g/dL (ref 30.0–36.0)
MCV: 85.9 fL (ref 80.0–100.0)
Platelets: 162 10*3/uL (ref 150–400)
RBC: 2.98 MIL/uL — ABNORMAL LOW (ref 3.87–5.11)
RDW: 16.4 % — ABNORMAL HIGH (ref 11.5–15.5)
WBC: 11.2 10*3/uL — ABNORMAL HIGH (ref 4.0–10.5)
nRBC: 0 % (ref 0.0–0.2)

## 2020-07-14 LAB — COMPREHENSIVE METABOLIC PANEL
ALT: 9 U/L (ref 0–44)
AST: 20 U/L (ref 15–41)
Albumin: 2.1 g/dL — ABNORMAL LOW (ref 3.5–5.0)
Alkaline Phosphatase: 90 U/L (ref 38–126)
Anion gap: 9 (ref 5–15)
BUN: <5 mg/dL — ABNORMAL LOW (ref 6–20)
CO2: 22 mmol/L (ref 22–32)
Calcium: 8.6 mg/dL — ABNORMAL LOW (ref 8.9–10.3)
Chloride: 105 mmol/L (ref 98–111)
Creatinine, Ser: 0.49 mg/dL (ref 0.44–1.00)
GFR, Estimated: >60 mL/min (ref >60)
Glucose, Bld: 73 mg/dL (ref 70–99)
Potassium: 3 mmol/L — ABNORMAL LOW (ref 3.5–5.1)
Sodium: 136 mmol/L (ref 135–145)
Total Bilirubin: 0.6 mg/dL (ref 0.3–1.2)
Total Protein: 4.9 g/dL — ABNORMAL LOW (ref 6.5–8.1)

## 2020-07-14 LAB — MAGNESIUM: Magnesium: 1.6 mg/dL — ABNORMAL LOW (ref 1.7–2.4)

## 2020-07-14 LAB — RPR: RPR Ser Ql: NONREACTIVE

## 2020-07-14 MED ORDER — AMLODIPINE BESYLATE 5 MG PO TABS
5.0000 mg | ORAL_TABLET | Freq: Every day | ORAL | Status: DC
  Administered 2020-07-14: 5 mg via ORAL
  Filled 2020-07-14: qty 1

## 2020-07-14 MED ORDER — FERROUS SULFATE 325 (65 FE) MG PO TABS
325.0000 mg | ORAL_TABLET | ORAL | Status: DC
  Administered 2020-07-14: 325 mg via ORAL
  Filled 2020-07-14: qty 1

## 2020-07-14 MED ORDER — IBUPROFEN 800 MG PO TABS: 800.0000 mg | ORAL_TABLET | Freq: Three times a day (TID) | ORAL | 0 refills | Status: DC | PRN

## 2020-07-14 MED ORDER — AMLODIPINE BESYLATE 5 MG PO TABS: 5.0000 mg | ORAL_TABLET | Freq: Every day | ORAL | 0 refills | Status: AC

## 2020-07-14 NOTE — Discharge Instructions (Signed)

## 2020-07-14 NOTE — Progress Notes (Signed)
Post Partum Day 1 Subjective: no complaints, up ad lib, voiding, tolerating PO and patient states significant improvement in pain from yesterday. Slept well. Baby is bottle feeding without difficulty. Mild LE edema noted without pain.   Objective: Blood pressure 129/90, pulse 72, temperature 98.4 F (36.9 C), temperature source Oral, resp. rate 18, height 5\' 2"  (1.575 m), weight 76.7 kg, last menstrual period 09/03/2019, SpO2 99 %, unknown if currently breastfeeding.  Physical Exam:  General: alert, cooperative and appears stated age Lochia: appropriate Uterine Fundus: firm Incision: N/A DVT Evaluation: No evidence of DVT seen on physical exam. No cords or calf tenderness. Calf/Ankle edema is present. Mild, non-pitting edema noted equally bilaterally.   Recent Labs    07/12/20 2337 07/14/20 0659  HGB 9.6* 8.1*  HCT 30.7* 25.6*    Assessment/Plan: Plan for discharge tomorrow and Contraception declined  Will re-start home dosing of QOD Iron supplement today  Continue to monitor BP   LOS: 1 day   09/11/20 07/14/2020, 8:59 AM

## 2020-07-19 ENCOUNTER — Ambulatory Visit: Payer: Medicaid Other

## 2020-08-10 ENCOUNTER — Ambulatory Visit: Payer: Medicaid Other | Admitting: Women's Health

## 2020-09-05 ENCOUNTER — Ambulatory Visit: Payer: Medicaid Other | Admitting: Advanced Practice Midwife

## 2020-09-13 ENCOUNTER — Telehealth: Payer: Self-pay

## 2020-09-13 NOTE — Telephone Encounter (Signed)
Left VMM to call and reschedule PP appt. [phone] 

## 2020-11-21 ENCOUNTER — Emergency Department (HOSPITAL_COMMUNITY): Payer: Medicaid Other

## 2020-11-21 ENCOUNTER — Encounter (HOSPITAL_COMMUNITY): Payer: Self-pay

## 2020-11-21 ENCOUNTER — Emergency Department (HOSPITAL_COMMUNITY)
Admission: EM | Admit: 2020-11-21 | Discharge: 2020-11-21 | Disposition: A | Discharge status: Home / Self Care | Payer: Medicaid Other | Attending: Emergency Medicine | Admitting: Emergency Medicine

## 2020-11-21 DIAGNOSIS — N132 Hydronephrosis with renal and ureteral calculous obstruction: Secondary | ICD-10-CM | POA: Diagnosis not present

## 2020-11-21 DIAGNOSIS — K8020 Calculus of gallbladder without cholecystitis without obstruction: Secondary | ICD-10-CM | POA: Diagnosis not present

## 2020-11-21 DIAGNOSIS — N23 Unspecified renal colic: Secondary | ICD-10-CM | POA: Insufficient documentation

## 2020-11-21 DIAGNOSIS — R110 Nausea: Secondary | ICD-10-CM | POA: Diagnosis not present

## 2020-11-21 DIAGNOSIS — R109 Unspecified abdominal pain: Secondary | ICD-10-CM | POA: Diagnosis present

## 2020-11-21 DIAGNOSIS — R1084 Generalized abdominal pain: Secondary | ICD-10-CM | POA: Diagnosis not present

## 2020-11-21 DIAGNOSIS — N200 Calculus of kidney: Secondary | ICD-10-CM | POA: Diagnosis not present

## 2020-11-21 LAB — BASIC METABOLIC PANEL
Anion gap: 9 (ref 5–15)
BUN: 8 mg/dL (ref 6–20)
CO2: 23 mmol/L (ref 22–32)
Calcium: 9.5 mg/dL (ref 8.9–10.3)
Chloride: 106 mmol/L (ref 98–111)
Creatinine, Ser: 0.83 mg/dL (ref 0.44–1.00)
GFR, Estimated: >60 mL/min (ref >60)
Glucose, Bld: 122 mg/dL — ABNORMAL HIGH (ref 70–99)
Potassium: 3.6 mmol/L (ref 3.5–5.1)
Sodium: 138 mmol/L (ref 135–145)

## 2020-11-21 LAB — CBC WITH DIFFERENTIAL/PLATELET
Abs Immature Granulocytes: 0.07 10*3/uL (ref 0.00–0.07)
Basophils Absolute: 0 10*3/uL (ref 0.0–0.1)
Basophils Relative: 0 %
Eosinophils Absolute: 0.1 10*3/uL (ref 0.0–0.5)
Eosinophils Relative: 1 %
HCT: 38.8 % (ref 36.0–46.0)
Hemoglobin: 12.7 g/dL (ref 12.0–15.0)
Immature Granulocytes: 1 %
Lymphocytes Relative: 9 %
Lymphs Abs: 1.2 10*3/uL (ref 0.7–4.0)
MCH: 26.8 pg (ref 26.0–34.0)
MCHC: 32.7 g/dL (ref 30.0–36.0)
MCV: 82 fL (ref 80.0–100.0)
Monocytes Absolute: 0.5 10*3/uL (ref 0.1–1.0)
Monocytes Relative: 4 %
Neutro Abs: 11.6 10*3/uL — ABNORMAL HIGH (ref 1.7–7.7)
Neutrophils Relative %: 85 %
Platelets: 327 10*3/uL (ref 150–400)
RBC: 4.73 MIL/uL (ref 3.87–5.11)
RDW: 14.3 % (ref 11.5–15.5)
WBC: 13.5 10*3/uL — ABNORMAL HIGH (ref 4.0–10.5)
nRBC: 0 % (ref 0.0–0.2)

## 2020-11-21 LAB — I-STAT BETA HCG BLOOD, ED (MC, WL, AP ONLY): I-stat hCG, quantitative: <5 m[IU]/mL (ref <5)

## 2020-11-21 MED ORDER — IBUPROFEN 600 MG PO TABS: 600.0000 mg | ORAL_TABLET | Freq: Four times a day (QID) | ORAL | 0 refills | Status: DC | PRN

## 2020-11-21 MED ORDER — ONDANSETRON 8 MG PO TBDP: 8.0000 mg | ORAL_TABLET | Freq: Three times a day (TID) | ORAL | 0 refills | Status: AC | PRN

## 2020-11-21 MED ORDER — ACETAMINOPHEN ER 650 MG PO TBCR: 650.0000 mg | EXTENDED_RELEASE_TABLET | Freq: Three times a day (TID) | ORAL | 0 refills | Status: DC

## 2020-11-21 MED ORDER — ONDANSETRON 8 MG PO TBDP: 8.0000 mg | ORAL_TABLET | Freq: Three times a day (TID) | ORAL | 0 refills | Status: DC | PRN

## 2020-11-21 MED ORDER — ACETAMINOPHEN ER 650 MG PO TBCR: 650.0000 mg | EXTENDED_RELEASE_TABLET | Freq: Three times a day (TID) | ORAL | 0 refills | Status: AC

## 2020-11-21 MED ORDER — KETOROLAC TROMETHAMINE 30 MG/ML IJ SOLN
15.0000 mg | Freq: Once | INTRAMUSCULAR | Status: AC
  Administered 2020-11-21: 17:00:00 15 mg via INTRAVENOUS
  Filled 2020-11-21: qty 1

## 2020-11-21 MED ORDER — HYDROCODONE-ACETAMINOPHEN 5-325 MG PO TABS: 1.0000 | ORAL_TABLET | Freq: Four times a day (QID) | ORAL | 0 refills | Status: DC | PRN

## 2020-11-21 MED ORDER — IBUPROFEN 600 MG PO TABS: 600.0000 mg | ORAL_TABLET | Freq: Four times a day (QID) | ORAL | 0 refills | Status: AC | PRN

## 2020-11-21 MED ORDER — HYDROCODONE-ACETAMINOPHEN 5-325 MG PO TABS: 1.0000 | ORAL_TABLET | Freq: Four times a day (QID) | ORAL | 0 refills | Status: AC | PRN

## 2020-11-21 MED ORDER — ONDANSETRON HCL 4 MG/2ML IJ SOLN
4.0000 mg | Freq: Once | INTRAMUSCULAR | Status: AC
  Administered 2020-11-21: 16:00:00 4 mg via INTRAVENOUS
  Filled 2020-11-21: qty 2

## 2020-11-21 MED ORDER — MORPHINE SULFATE (PF) 4 MG/ML IV SOLN
6.0000 mg | Freq: Once | INTRAVENOUS | Status: AC
  Administered 2020-11-21: 16:00:00 6 mg via INTRAVENOUS
  Filled 2020-11-21: qty 2

## 2020-11-21 NOTE — ED Triage Notes (Signed)
Pt BIB [facility], en route to urgent care and called out for right flank pain radiating to R groin starting this morning. Endorses increasing pain urinating over the last few hours. No hx UTI or kidney stones. 20ga LAC, 100 mcg fent total  BP 140/94 HR 90 RR 22 SpO2 99% RA

## 2020-11-21 NOTE — Discharge Instructions (Addendum)
We saw you in the ER for the abdominal pain. °Our results indicate that you have a kidney stone. °We were able to get your pain is relative control, and we can safely send you home. ° °Take the meds prescribed. °Set up an appointment with the Urologist. °If the pain is unbearable, you start having fevers, chills, and are unable to keep any meds down - then return to the ER. °  °

## 2020-11-21 NOTE — ED Provider Notes (Addendum)
[facility]-EMERGENCY DEPT Provider Note   CSN: [id number] Arrival date & time: 11/21/20  1437     History Chief Complaint  Patient presents with  . Flank Pain  . Dysuria    [facility] Laurie [name] is a 20 y.o. female.  HPI    20 year old female comes in a chief complaint of sudden onset flank pain and difficulty urinating.  Patient has no medical history.  Laurie [name] reports that her LMP was last week.  Around 10:00 Laurie [name] started having sudden onset right-sided flank pain that is radiating to the groin.  The pain is constant.  No history of similar pain.  No history of gallstones, kidney stone.  Laurie [name] is having some trouble breathing because of the pain.  Patient denies any burning with urination, but Laurie [name] is having some hesitancy.  Past Medical History:  Diagnosis Date  . Medical history non-contributory     Patient Active Problem List   Diagnosis Date Noted  . Pregnant 07/13/2020  . Preeclampsia 07/13/2020  . Back pain affecting pregnancy in third trimester 05/11/2020  . Supervision of normal first pregnancy, antepartum 11/13/2019    Past Surgical History:  Procedure Laterality Date  . NO PAST SURGERIES       OB History     Gravida  1   Para  1   Term  1   Preterm      AB      Living  1      SAB      IAB      Ectopic      Multiple  0   Live Births  1           No family history on file.  Social History   Tobacco Use  . Smoking status: Never  . Smokeless tobacco: Never  Substance Use Topics  . Alcohol use: Never  . Drug use: Never    Home Medications Prior to Admission medications   Medication Sig Start Date End Date Taking? Authorizing Provider  acetaminophen (TYLENOL 8 HOUR) 650 MG CR tablet Take 1 tablet (650 mg total) by mouth every 8 (eight) hours. 11/21/20  Yes Derwood Kaplan, MD  HYDROcodone-acetaminophen (NORCO/VICODIN) 5-325 MG tablet Take 1 tablet by mouth every 6 (six) hours as needed for severe pain. 11/21/20  Yes  Derwood Kaplan, MD  ibuprofen (ADVIL) 600 MG tablet Take 1 tablet (600 mg total) by mouth every 6 (six) hours as needed. 11/21/20  Yes Dontavia Brand, MD  ondansetron (ZOFRAN ODT) 8 MG disintegrating tablet Take 1 tablet (8 mg total) by mouth every 8 (eight) hours as needed for nausea. 11/21/20  Yes Danyele Smejkal, MD  amLODipine (NORVASC) 5 MG tablet Take 1 tablet (5 mg total) by mouth daily. 07/15/20   Cora Collum, DO  ferrous sulfate 325 (65 FE) MG tablet Take 325 mg by mouth every other day.    [provider]    Allergies    Patient has no known allergies.  Review of Systems   Review of Systems  Constitutional:  Positive for activity change.  Gastrointestinal:  Positive for abdominal distention.  Genitourinary:  Positive for flank pain.  Allergic/Immunologic: Negative for immunocompromised state.  All other systems reviewed and are negative.  Physical Exam Updated Vital Signs BP 128/89   Pulse 74   Temp 98.3 F (36.8 C) (Oral)   Resp 16   Ht 5\' 2"  (1.575 m)   Wt 63.5 kg   LMP 11/14/2020 (Approximate)  SpO2 94%   BMI 25.61 kg/m   Physical Exam Vitals and nursing note reviewed.  Constitutional:      Appearance: Laurie [name] is well-developed.  HENT:     Head: Atraumatic.  Cardiovascular:     Rate and Rhythm: Normal rate.  Pulmonary:     Effort: Pulmonary effort is normal.  Abdominal:     Tenderness: There is abdominal tenderness. There is no guarding or rebound.  Musculoskeletal:     Cervical back: Normal range of motion and neck supple.  Skin:    General: Skin is warm and dry.  Neurological:     Mental Status: Laurie [name] is alert and oriented to person, place, and time.    ED Results / Procedures / Treatments   Labs (all labs ordered are listed, but only abnormal results are displayed) Labs Reviewed  BASIC METABOLIC PANEL - Abnormal; Notable for the following components:      Result Value   Glucose, Bld 122 (*)    All other components within normal limits   CBC WITH DIFFERENTIAL/PLATELET - Abnormal; Notable for the following components:   WBC 13.5 (*)    Neutro Abs 11.6 (*)    All other components within normal limits  URINALYSIS, ROUTINE W REFLEX MICROSCOPIC  I-STAT BETA HCG BLOOD, ED ([facility], [facility], [facility] ONLY)    EKG None  Radiology CT Renal Stone Study  Result Date: 11/21/2020 CLINICAL DATA:  Flank pain EXAM: CT ABDOMEN AND PELVIS WITHOUT CONTRAST TECHNIQUE: Multidetector CT imaging of the abdomen and pelvis was performed following the standard protocol without IV contrast. COMPARISON:  None. FINDINGS: Lower chest: Lung bases demonstrate mild scarring in the right middle lobe. Hepatobiliary: Small gallstones. No focal hepatic abnormality or biliary dilatation Pancreas: Unremarkable. No pancreatic ductal dilatation or surrounding inflammatory changes. Spleen: Normal in size without focal abnormality. Adrenals/Urinary Tract: Adrenal glands are normal. Punctate stone within the mid right kidney. Moderate to marked right hydronephrosis and hydroureter, secondary to a 3 mm stone at the right UVJ. Stomach/Bowel: Stomach is within normal limits. Appendix appears normal. No evidence of bowel wall thickening, distention, or inflammatory changes. Probable collapsed appearance of the transverse colon, splenic flexure and descending colon. Vascular/Lymphatic: No significant vascular findings are present. No enlarged abdominal or pelvic lymph nodes. Reproductive: Uterus and bilateral adnexa are unremarkable. Other: Negative for free air or free fluid. Musculoskeletal: No acute or significant osseous findings. IMPRESSION: 1. Moderate to marked right hydronephrosis and hydroureter, secondary to a 3 mm stone at the right UVJ. 2. Punctate intrarenal stones on the right Electronically Signed   By: Jasmine Pang M.D.   On: 11/21/2020 16:35    Procedures Procedures   Medications Ordered in ED Medications  morphine 4 MG/ML injection 6 mg (6 mg Intravenous Given 11/21/20  1544)  ondansetron (ZOFRAN) injection 4 mg (4 mg Intravenous Given 11/21/20 1543)  ketorolac (TORADOL) 30 MG/ML injection 15 mg (15 mg Intravenous Given 11/21/20 1643)    ED Course  I have reviewed the triage vital signs and the nursing notes.  Pertinent labs & imaging results that were available during my care of the patient were reviewed by me and considered in my medical decision making (see chart for details).  Clinical Course as of 11/21/20 1720  Mon Nov 21, 2020  2683 Patient reassessed.  After Toradol Laurie [name] feels a lot better. Stable for discharge. [AN]    Clinical Course User Index [AN] Derwood Kaplan, MD   MDM Rules/Calculators/A&P  20 year old female comes in a chief complaint of sudden onset abdominal pain.  Her pain is located over the right flank and the right side of the abdomen.  Laurie [name] does not have any UTI-like symptoms besides some urinary hesitancy and no history of kidney stones or gallstones.  Differential diagnosis includes pyelonephritis, perforation, nephrolithiasis and cholelithiasis.  Ovarian torsion, PID also possible given the acute and severe nature of the pain.  CT renal stone ordered and it is confirms right-sided hydronephrosis with a 3 mm UPJ stone.  Toradol ordered.  Will reassess.  Final Clinical Impression(s) / ED Diagnoses Final diagnoses:  Ureteral colic    Rx / DC Orders ED Discharge Orders          Ordered    HYDROcodone-acetaminophen (NORCO/VICODIN) 5-325 MG tablet  Every 6 hours PRN        11/21/20 1719    ondansetron (ZOFRAN ODT) 8 MG disintegrating tablet  Every 8 hours PRN        11/21/20 1719    acetaminophen (TYLENOL 8 HOUR) 650 MG CR tablet  Every 8 hours        11/21/20 1719    ibuprofen (ADVIL) 600 MG tablet  Every 6 hours PRN        11/21/20 1719             Derwood Kaplan, MD 11/21/20 1652    Derwood Kaplan, MD 11/21/20 1720

## 2021-07-26 ENCOUNTER — Ambulatory Visit: Payer: Medicaid Other

## 2021-08-04 DIAGNOSIS — R21 Rash and other nonspecific skin eruption: Secondary | ICD-10-CM | POA: Diagnosis not present

## 2021-09-11 ENCOUNTER — Ambulatory Visit: Payer: Medicaid Other | Admitting: Advanced Practice Midwife

## 2021-09-11 DIAGNOSIS — R87610 Atypical squamous cells of undetermined significance on cytologic smear of cervix (ASC-US): Secondary | ICD-10-CM | POA: Insufficient documentation

## 2021-09-11 NOTE — Progress Notes (Deleted)
? ?  Subjective:  ?  ? Laurie [name] is a 21 y.o. female here at [facility] *** for a routine exam.  Current complaints: ***.  Personal health questionnaire reviewed: {yes/no:9010}. ? ?Do you have a primary care provider? *** ?Do you feel safe at home? *** ? ? ? ?Health Maintenance Due  ?Topic Date Due  ? HPV VACCINES (1 - 2-dose series) Never done  ?  ? ?Risk factors for chronic health problems: ?Smoking: ?Alchohol/how much: ?Pt BMI: There is no height or weight on file to calculate BMI. ?  ?Gynecologic History ?No LMP recorded. ?Contraception: {method:5051} ?Last Pap: 11/2019. Results were: abnormal with ASCUS and positive HPV ?Last mammogram: n/a.  ? ?Obstetric History ?OB History  ?Gravida Para Term Preterm AB Living  ?1 1 1     1   ?SAB IAB Ectopic Multiple Live Births  ?      0 1  ?  ?# Outcome Date GA Lbr Len/2nd Weight Sex Delivery Anes PTL Lv  ?1 Term 07/13/20 [redacted]w[redacted]d 16:01 / 00:48 6 lb 15.3 oz (3.155 kg) F Vag-Spont EPI  LIV  ? ? ? ?{Common ambulatory SmartLinks:19316} ? ?Review of Systems ?{ros; complete:30496}  ?  ?Objective:  ? ?There were no vitals taken for this visit. ?VS reviewed, nursing note reviewed,  ?Constitutional: well developed, well nourished, no distress ?HEENT: normocephalic ?CV: normal rate ?Pulm/chest wall: normal effort ?Breast Exam:  ***Deferred with low risks and shared decision making, discussed recommendation to start mammogram between 40-50 yo/ exam performed: right breast normal without mass, skin or nipple changes or axillary nodes, left breast normal without mass, skin or nipple changes or axillary nodes ?Abdomen: soft ?Neuro: alert and oriented x 3 ?Skin: warm, dry ?Psych: affect normal ?Pelvic exam: ***Deferred/ Performed: Cervix pink, visually closed, without lesion, scant white creamy discharge, vaginal walls and external genitalia normal ?Bimanual exam: Cervix 0/long/high, firm, anterior, neg CMT, uterus nontender, nonenlarged, adnexa without tenderness, enlargement, or mass  ? ? ?    ?Assessment/Plan:  ? ?There are no diagnoses linked to this encounter.  ? ? ? ? ?Follow up in: {1-10:13787:::0} {time; units:19136:::0} or as needed.  ? ?Fatima Blank, CNM ?8:32 AM   ?

## 2022-03-21 IMAGING — US US MFM OB COMP +14 WKS
1 series · 13 of 28 positions shown · non-contrast
Comparison: none

[Series 1: us mfm ob comp +14 wks · 63 acquisitions, 13 frames shown]
[im 3/63]
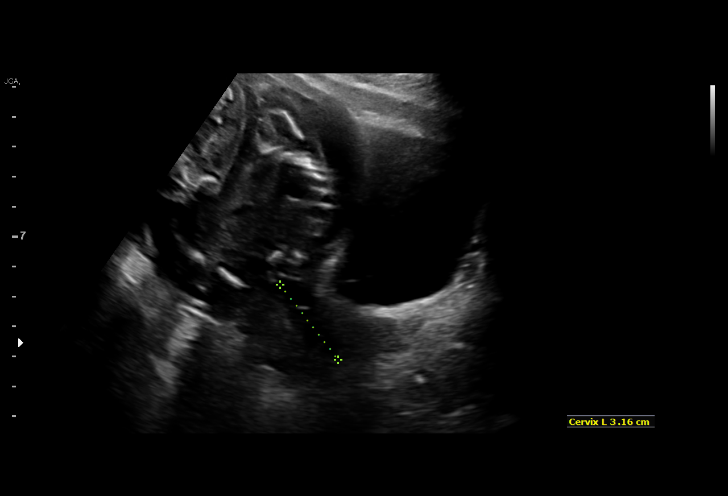
[im 7/63]
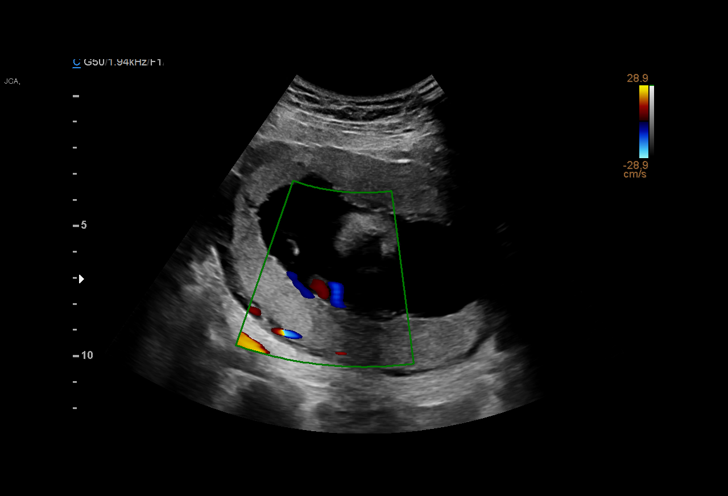
[im 12/63]
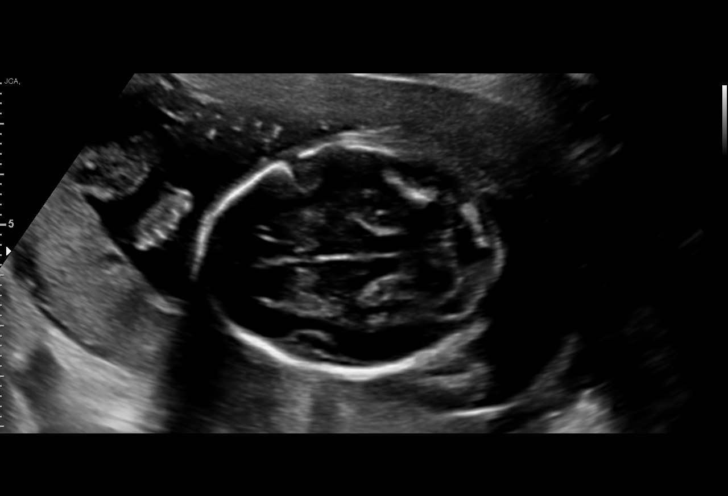
[im 17/63]
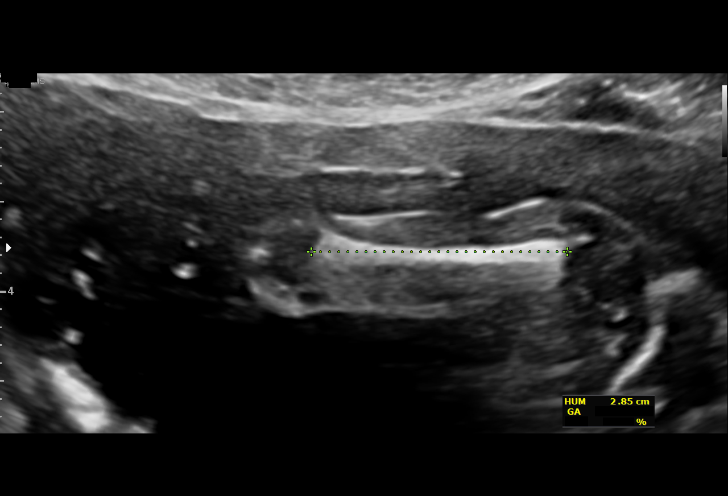
[im 21/63]
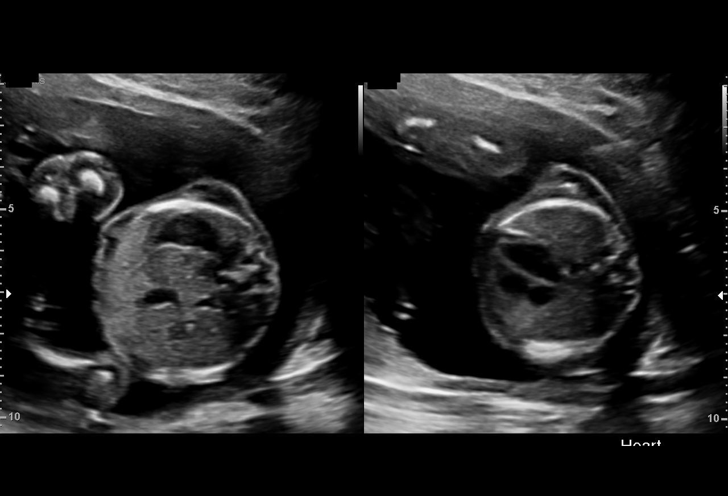
[im 26/63]
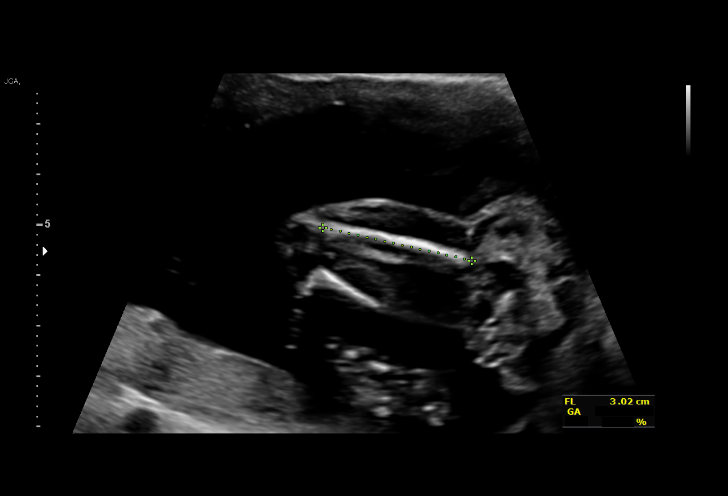
[im 33/63]
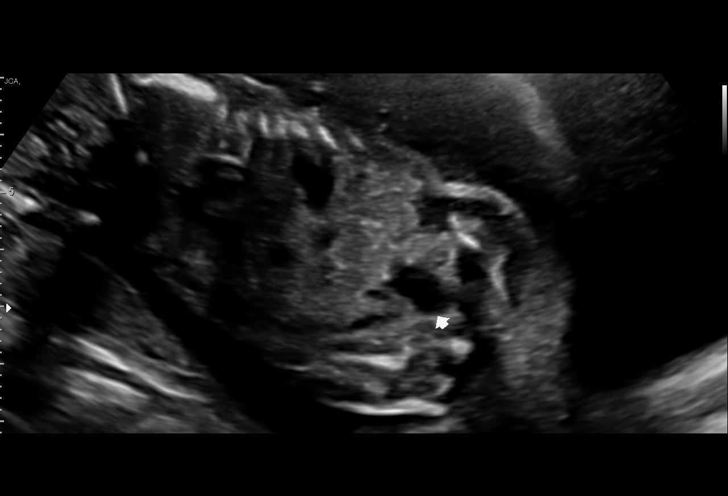
[im 37/63]
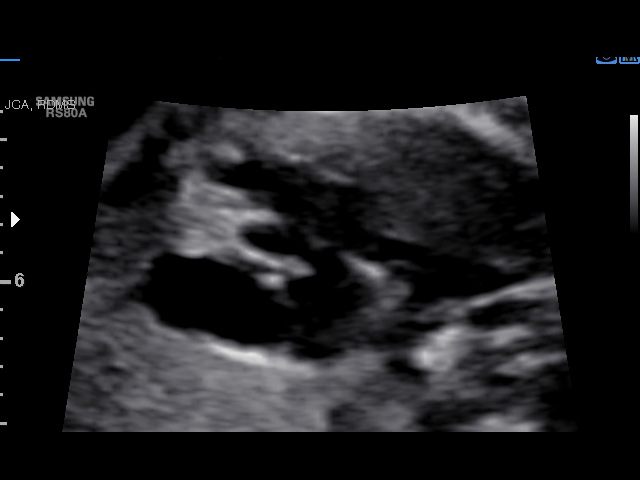
[im 42/63]
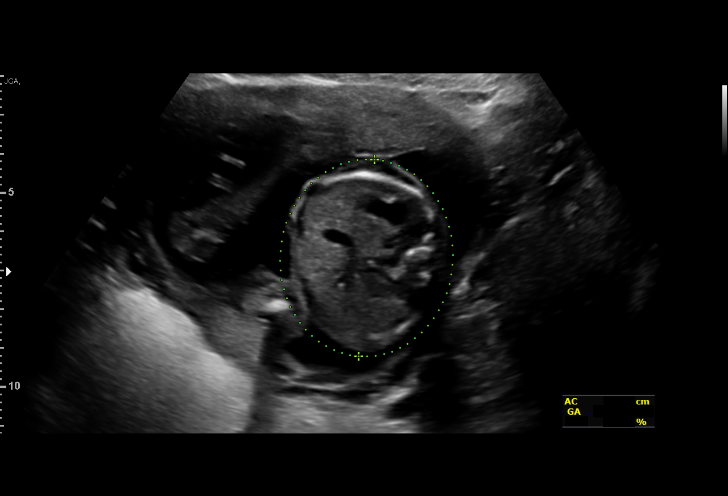
[im 46/63]
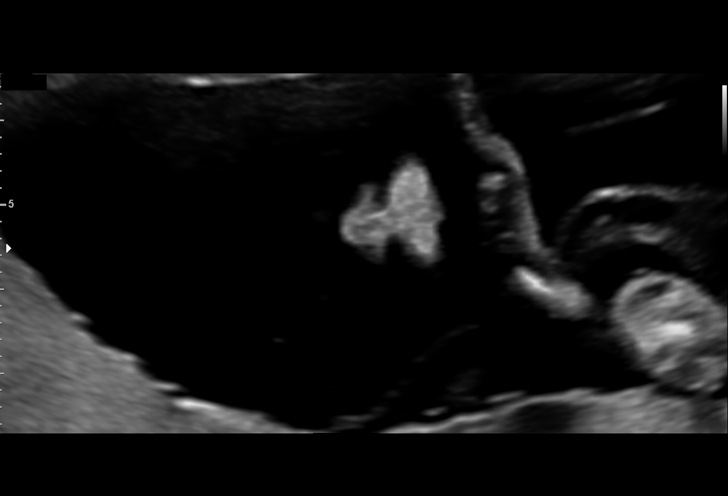
[im 51/63]
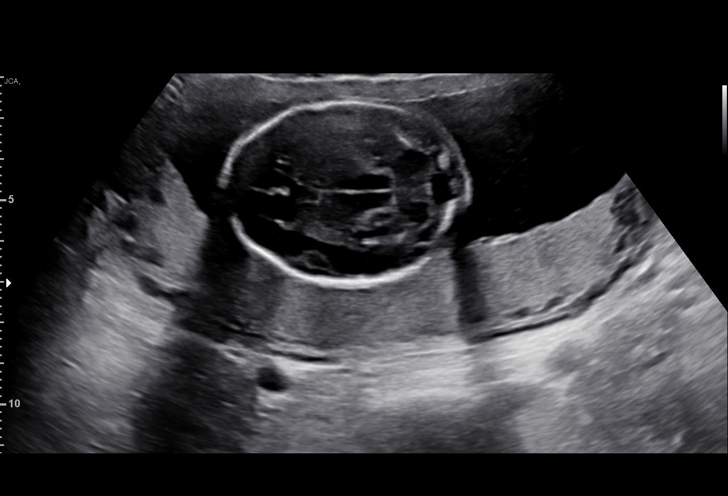
[im 56/63]
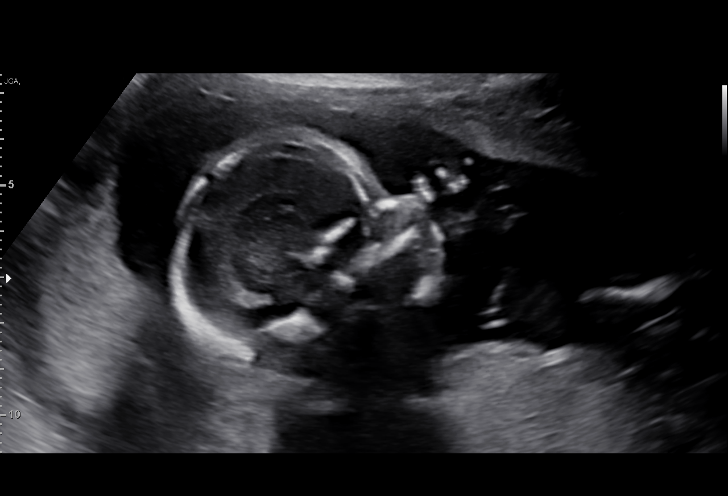
[im 60/63]
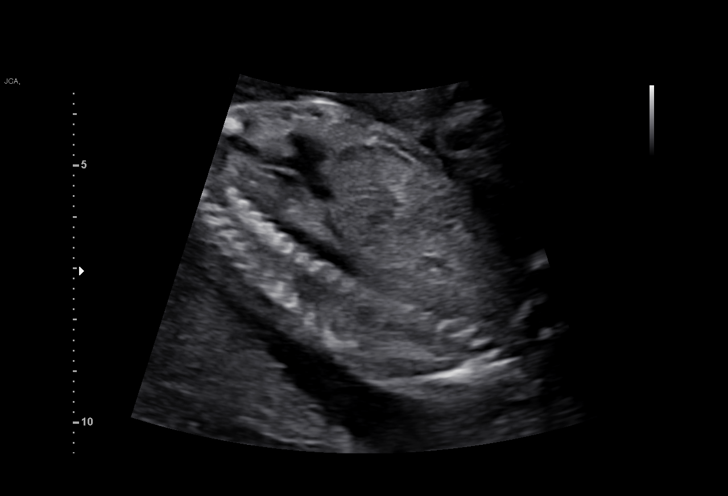

[13 of 28 positions shown; findings below may reference images not displayed]

[REDACTED]care

Indications

 Encounter for antenatal screening for
 malformations
 19 weeks gestation of pregnancy
 Low Risk NIPS, Neg Horizon
Fetal Evaluation

 Num Of Fetuses:         1
 Fetal Heart Rate(bpm):  143
 Cardiac Activity:       Observed
 Presentation:           Breech
 Placenta:               Posterior Fundal
 P. Cord Insertion:      Visualized

 Amniotic Fluid
 AFI FV:      Within normal limits

                             Largest Pocket(cm)

Biometry

 BPD:      44.6  mm     G. Age:  19w 3d         53  %    CI:        71.76   %    70 - 86
                                                         FL/HC:      18.0   %    16.1 -
 HC:      167.6  mm     G. Age:  19w 3d         42  %    HC/AC:      1.12        1.09 -
 AC:      149.2  mm     G. Age:  20w 1d         70  %    FL/BPD:     67.5   %
 FL:       30.1  mm     G. Age:  19w 2d         38  %    FL/AC:      20.2   %    20 - 24
 HUM:      28.1  mm     G. Age:  19w 0d         41  %
 CER:      21.6  mm     G. Age:  20w 3d         88  %

 CM:        3.7  mm

 Est. FW:     309  gm    0 lb 11 oz      63  %
Gestational Age

 U/S Today:     19w 4d                                        EDD:   07/15/20
 Best:          19w 3d     Det. By:  U/S C R L  (12/01/19)    EDD:   07/16/20
Anatomy

 Cranium:               Appears normal         Aortic Arch:            Not well visualized
 Cavum:                 Appears normal         Ductal Arch:            Not well visualized
 Ventricles:            Not well visualized    Diaphragm:              Not well visualized
 Choroid Plexus:        Appears normal         Stomach:                Appears normal, left
                                                                       sided
 Cerebellum:            Appears normal         Abdomen:                Appears normal
 Posterior Fossa:       Appears normal         Abdominal Wall:         Appears nml (cord
                                                                       insert, abd wall)
 Nuchal Fold:           Not well visualized    Cord Vessels:           Appears normal (3
                                                                       vessel cord)
 Face:                  Not well visualized    Kidneys:                Not well visualized
 Lips:                  Appears normal         Bladder:                Appears normal
 Thoracic:              Appears normal         Spine:                  Not well visualized
 Heart:                 Appears normal         Upper Extremities:      Appears normal
                        (4CH, axis, and
                        situs)
 RVOT:                  Not well visualized    Lower Extremities:      Appears normal
 LVOT:                  Not well visualized

 Other:  Nasal bone visualized. Technically difficult due to fetal position.
Cervix Uterus Adnexa

 Cervix
 Length:           3.16  cm.
Comments

 This patient was seen for a detailed fetal anatomy scan.
 She denies any significant past medical history and denies
 any problems in her current pregnancy.
 She had a cell free DNA test earlier in her pregnancy which
 indicated a low risk for trisomy 21, 18, and 13. A female fetus
 is predicted.
 She was informed that the fetal growth and amniotic fluid
 level were appropriate for her gestational age.
 There were no obvious fetal anomalies noted on today's
 ultrasound exam.  However, the views of the fetal anatomy
 were limited today due to the fetal position.
 The patient was informed that anomalies may be missed due
 to technical limitations. If the fetus is in a suboptimal position
 or maternal habitus is increased, visualization of the fetus in
 the maternal uterus may be impaired.
 A follow-up exam was scheduled in 4 weeks to complete the
 views of the fetal anatomy.

## 2023-04-23 DIAGNOSIS — Z3A09 9 weeks gestation of pregnancy: Secondary | ICD-10-CM | POA: Diagnosis not present

## 2023-04-23 DIAGNOSIS — O3680X0 Pregnancy with inconclusive fetal viability, not applicable or unspecified: Secondary | ICD-10-CM | POA: Diagnosis not present

## 2023-05-21 DIAGNOSIS — Z3482 Encounter for supervision of other normal pregnancy, second trimester: Secondary | ICD-10-CM | POA: Diagnosis not present

## 2023-07-09 DIAGNOSIS — Z3689 Encounter for other specified antenatal screening: Secondary | ICD-10-CM | POA: Diagnosis not present

## 2023-07-09 DIAGNOSIS — Z3A19 19 weeks gestation of pregnancy: Secondary | ICD-10-CM | POA: Diagnosis not present

## 2023-09-02 DIAGNOSIS — Z369 Encounter for antenatal screening, unspecified: Secondary | ICD-10-CM | POA: Diagnosis not present

## 2023-09-02 DIAGNOSIS — Z3689 Encounter for other specified antenatal screening: Secondary | ICD-10-CM | POA: Diagnosis not present

## 2023-09-02 DIAGNOSIS — Z23 Encounter for immunization: Secondary | ICD-10-CM | POA: Diagnosis not present

## 2023-09-06 DIAGNOSIS — R7302 Impaired glucose tolerance (oral): Secondary | ICD-10-CM | POA: Diagnosis not present
